# Patient Record
Sex: Female | Born: 1955
Health system: Southern US, Community
[De-identification: ages and names within clinical notes are randomized; demographics above are authoritative.]

## PROBLEM LIST (undated history)

## (undated) DIAGNOSIS — I10 Essential (primary) hypertension: Secondary | ICD-10-CM

## (undated) DIAGNOSIS — K5792 Diverticulitis of intestine, part unspecified, without perforation or abscess without bleeding: Secondary | ICD-10-CM

## (undated) DIAGNOSIS — J449 Chronic obstructive pulmonary disease, unspecified: Secondary | ICD-10-CM

## (undated) DIAGNOSIS — E785 Hyperlipidemia, unspecified: Secondary | ICD-10-CM

## (undated) DIAGNOSIS — E119 Type 2 diabetes mellitus without complications: Secondary | ICD-10-CM

## (undated) DIAGNOSIS — G2581 Restless legs syndrome: Secondary | ICD-10-CM

## (undated) DIAGNOSIS — K219 Gastro-esophageal reflux disease without esophagitis: Secondary | ICD-10-CM

## (undated) DIAGNOSIS — Z720 Tobacco use: Secondary | ICD-10-CM

## (undated) DIAGNOSIS — Z8673 Personal history of transient ischemic attack (TIA), and cerebral infarction without residual deficits: Secondary | ICD-10-CM

## (undated) DIAGNOSIS — F39 Unspecified mood [affective] disorder: Secondary | ICD-10-CM

## (undated) DIAGNOSIS — G8929 Other chronic pain: Secondary | ICD-10-CM

## (undated) HISTORY — DX: Gastro-esophageal reflux disease without esophagitis: K21.9

## (undated) HISTORY — DX: Chronic obstructive pulmonary disease, unspecified: J44.9

## (undated) HISTORY — DX: Diverticulitis of intestine, part unspecified, without perforation or abscess without bleeding: K57.92

## (undated) HISTORY — DX: Type 2 diabetes mellitus without complications: E11.9

## (undated) HISTORY — DX: Unspecified mood (affective) disorder: F39

## (undated) HISTORY — DX: Tobacco use: Z72.0

## (undated) HISTORY — DX: Other chronic pain: G89.29

## (undated) HISTORY — DX: Restless legs syndrome: G25.81

## (undated) HISTORY — DX: Personal history of transient ischemic attack (TIA), and cerebral infarction without residual deficits: Z86.73

## (undated) HISTORY — DX: Hyperlipidemia, unspecified: E78.5

## (undated) HISTORY — DX: Essential (primary) hypertension: I10

---

## 2005-09-06 DIAGNOSIS — B070 Plantar wart: Secondary | ICD-10-CM

## 2005-09-06 HISTORY — DX: Plantar wart: B07.0

## 2018-12-06 DIAGNOSIS — R413 Other amnesia: Secondary | ICD-10-CM

## 2018-12-06 HISTORY — DX: Other amnesia: R41.3

## 2019-01-31 ENCOUNTER — Telehealth: Payer: Self-pay | Admitting: Internal Medicine

## 2019-01-31 NOTE — Telephone Encounter (Signed)
Attempted to call Inetta Fermo at [employer] to get specifics on what is needed in the office notes for the patient visit.  Left message to call back on Tina's voicemail.

## 2019-02-01 NOTE — Telephone Encounter (Signed)
LMTCB x2 for Olivia Bell.  We would need a records release before any documents could be given.

## 2019-02-02 NOTE — Telephone Encounter (Signed)
Called and spoke with [place] with [employer]. Per Inetta Fermo, she is aware that pt has an upcoming appt at our office 6/2 and Inetta Fermo stated she needs to make sure that work status gets addressed at this appt so she will be able to know when pt will be able to return to work and under what restrictions if there are any.  Looked under appts tab and it seems like this consult with MW is a workers com consult so this is why this info is needing to be addressed at the appt.  Routing to MW as an Lorain Childes as pt is having the consult with him 6/2.

## 2019-02-02 NOTE — Telephone Encounter (Signed)
aware

## 2019-02-06 ENCOUNTER — Other Ambulatory Visit: Payer: Self-pay

## 2019-02-06 ENCOUNTER — Ambulatory Visit (INDEPENDENT_AMBULATORY_CARE_PROVIDER_SITE_OTHER): Payer: Worker's Compensation

## 2019-02-06 ENCOUNTER — Encounter: Payer: Self-pay | Admitting: General Surgery

## 2019-02-06 ENCOUNTER — Encounter: Payer: Self-pay | Admitting: Internal Medicine

## 2019-02-06 ENCOUNTER — Ambulatory Visit (INDEPENDENT_AMBULATORY_CARE_PROVIDER_SITE_OTHER): Payer: Worker's Compensation | Admitting: Internal Medicine

## 2019-02-06 VITALS — BP 122/68 | HR 104 | Temp 97.9°F | Ht 66.0 in | Wt 237.6 lb

## 2019-02-06 DIAGNOSIS — I10 Essential (primary) hypertension: Secondary | ICD-10-CM | POA: Insufficient documentation

## 2019-02-06 DIAGNOSIS — R0789 Other chest pain: Secondary | ICD-10-CM | POA: Diagnosis not present

## 2019-02-06 DIAGNOSIS — F17210 Nicotine dependence, cigarettes, uncomplicated: Secondary | ICD-10-CM

## 2019-02-06 DIAGNOSIS — J449 Chronic obstructive pulmonary disease, unspecified: Secondary | ICD-10-CM | POA: Diagnosis not present

## 2019-02-06 IMAGING — DX CHEST - 2 VIEW
2 series · 2 of 2 positions shown · non-contrast
Comparison: None.

CLINICAL DATA: 62-year-old female with chest pain. Smoker.

EXAM:
CHEST - 2 VIEW

[chest pa]
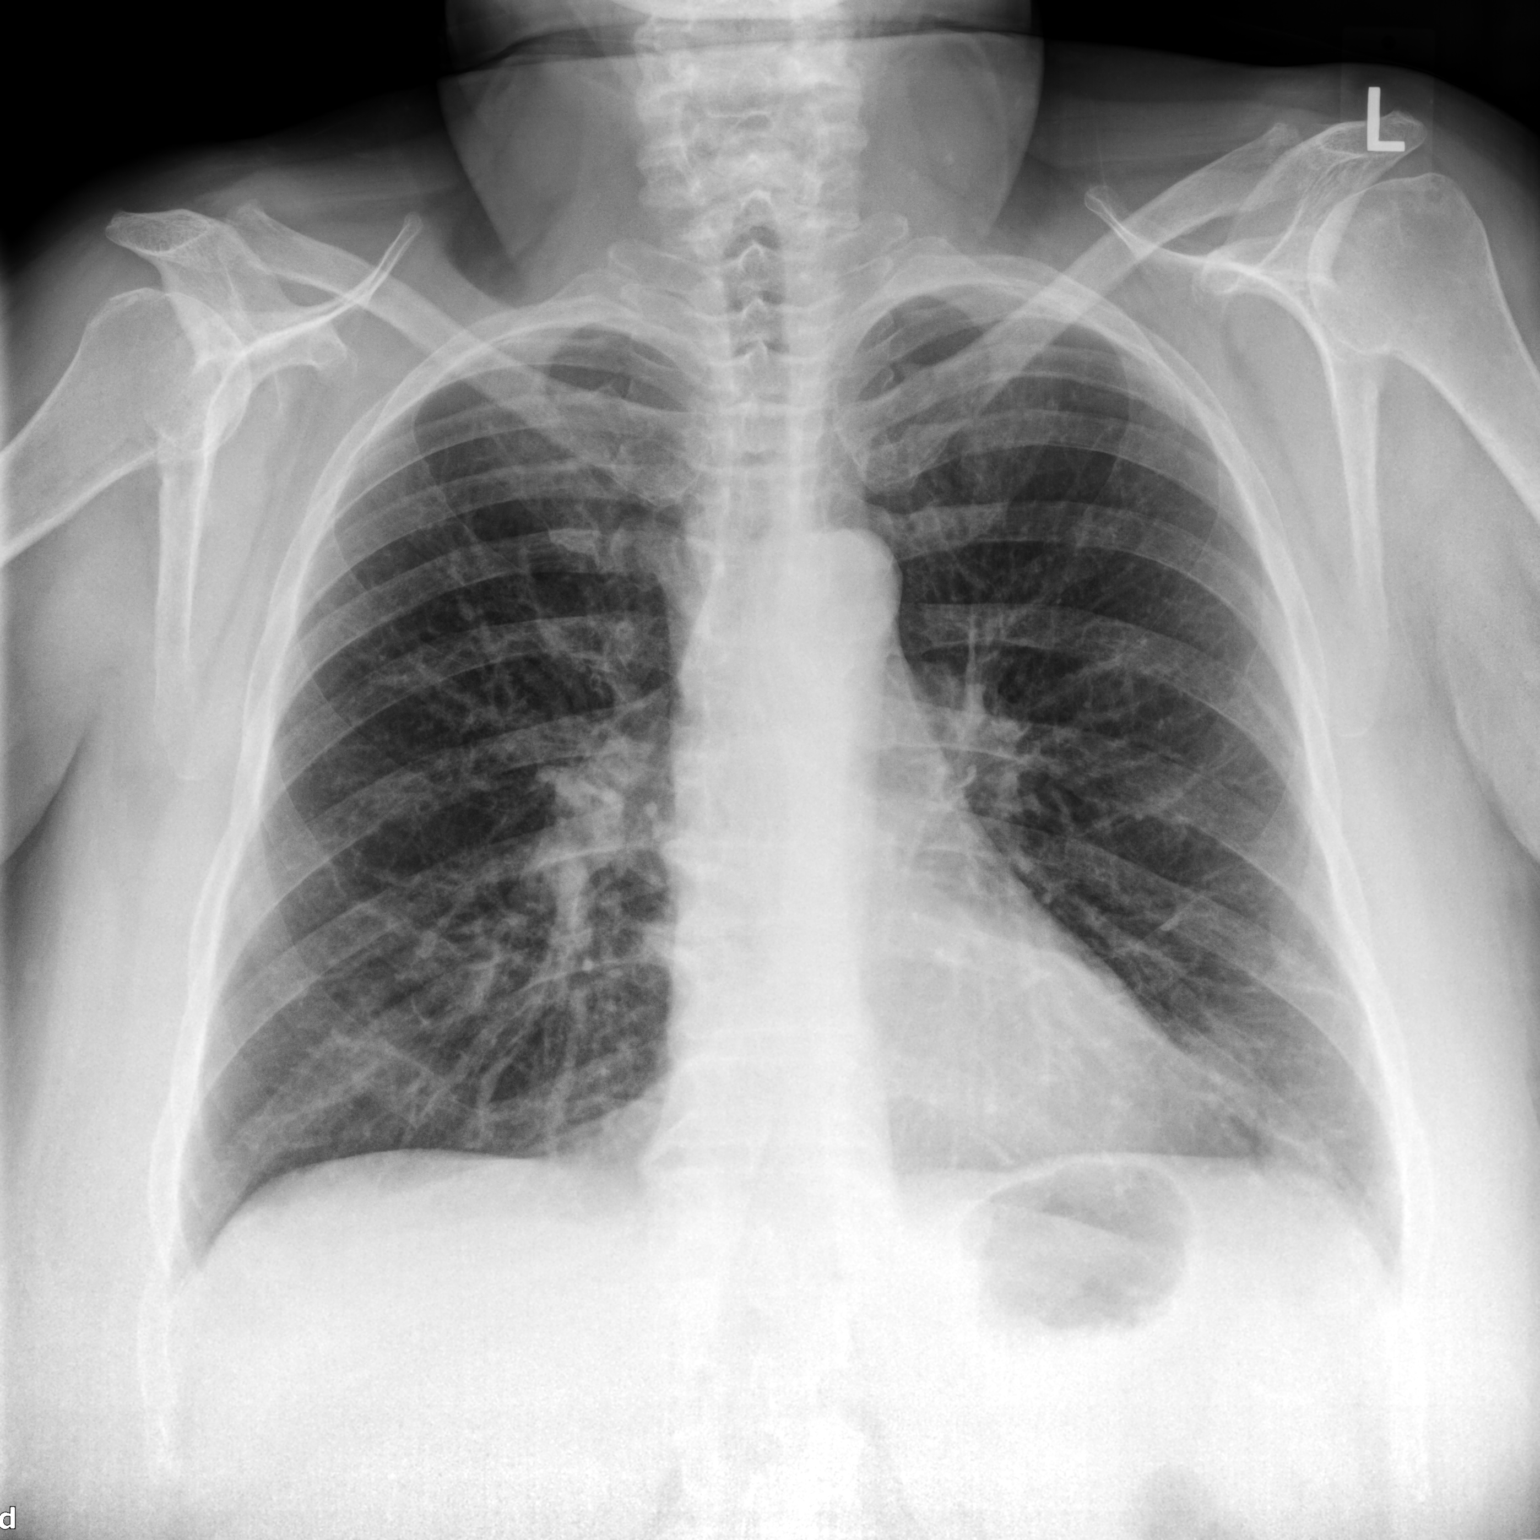

[chest lat]
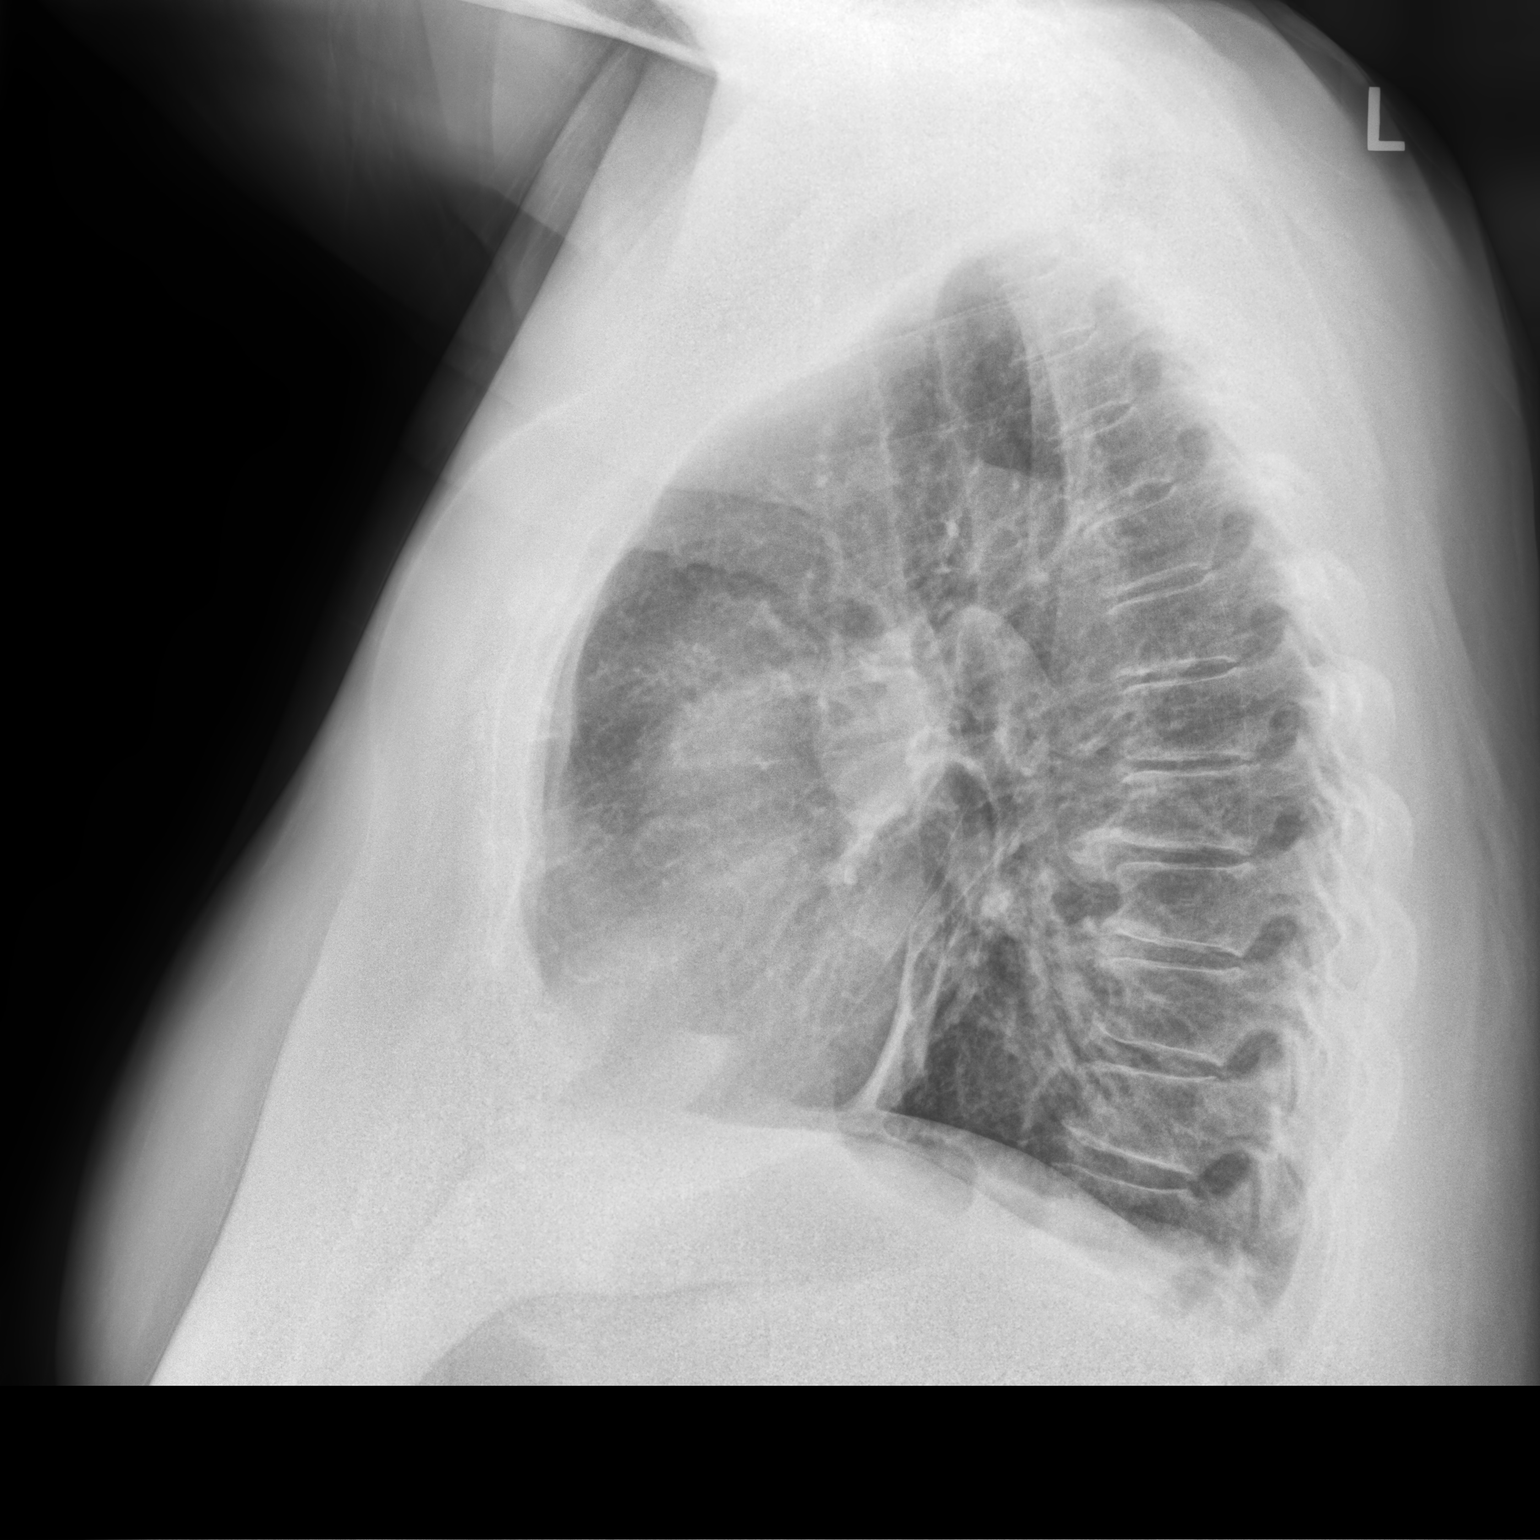

[2 of 2 positions shown; findings below may reference images not displayed]

FINDINGS: Lung volumes are at the upper limits of normal. Mediastinal contours
are within normal limits. Visualized tracheal air column is within
normal limits. No pneumothorax, pulmonary edema, pleural effusion or
confluent pulmonary opacity. Mildly increased interstitial markings,
likely smoking related.

No acute osseous abnormality identified. Negative visible bowel gas
pattern.
IMPRESSION: No acute cardiopulmonary abnormality; increased interstitial
markings are likely smoking related.

## 2019-02-06 MED ORDER — PREDNISONE 10 MG PO TABS: ORAL_TABLET | ORAL | 0 refills | Status: DC

## 2019-02-06 MED ORDER — ACETAMINOPHEN-CODEINE #3 300-30 MG PO TABS: 1.0000 | ORAL_TABLET | ORAL | 0 refills | Status: AC | PRN

## 2019-02-06 MED ORDER — ALBUTEROL SULFATE (2.5 MG/3ML) 0.083% IN NEBU: 2.5000 mg | INHALATION_SOLUTION | RESPIRATORY_TRACT | 12 refills | Status: DC | PRN

## 2019-02-06 MED ORDER — CLONIDINE HCL 0.1 MG PO TABS: 0.1000 mg | ORAL_TABLET | Freq: Two times a day (BID) | ORAL | 11 refills | Status: DC

## 2019-02-06 NOTE — Assessment & Plan Note (Signed)
D/c acei 02/06/2019 due to severe cough   ACE inhibitors are problematic in  pts with airway complaints because  even experienced pulmonologists can't always distinguish ace effects from copd/asthma.  By themselves they don't actually cause a problem, much like oxygen can't by itself start a fire, but they certainly serve as a powerful catalyst or enhancer for any "fire"  or inflammatory process in the upper airway, be it caused by an ET  tube or more commonly reflux (especially in the obese or pts with known GERD or who are on biphoshonates).    In the era of ARB near equivalency until we have a better handle on the reversibility of the airway problem, it just makes sense to avoid ACEI  entirely in the short run use clonidine 0.1 mg bid instead to help with cig w/d/ pain control / anxiety

## 2019-02-06 NOTE — Assessment & Plan Note (Addendum)
Active smoker  - CT Head 12/30/18 neg sinus findings   >>> rec just use neb q4 h prn since won't aggravate the cough which is preventing chest wall healing and of course work on d/c cigs (see separate a/p) plus 6 days prednisone

## 2019-02-06 NOTE — Patient Instructions (Addendum)
Prednisone 10 mg take  4 each am x 2 days,   2 each am x 2 days,  1 each am x 2 days and stop   Stop lisinopril   Clonidine 0.1 twice daily   For cough >  Take delsym two tsp every 12 hours and supplement if needed with  Tylenol #3   up to 1-2 every 4 hours to suppress the urge to cough. Swallowing water and/or using ice chips/non mint and menthol containing candies (such as lifesavers or sugarless jolly ranchers) are also effective.  You should rest your voice and avoid activities that you know make you cough.  Once you have eliminated the cough for 3 straight days try reducing the Tylenol #3 first,  then the delsym as tolerated.    For shortness of breath/ wheezing >  Nebulized albuterol  up to every 4 hours as needed  The key is to stop smoking completely before smoking completely stops you!   Please remember to go to the lab and x-ray department   for your tests - we will call you with the results when they are available.      Please schedule a follow up office visit in 2 weeks, sooner if needed  with all medications /inhalers/ solutions in hand so we can verify exactly what you are taking. This includes all medications from all doctors and over the counters   Late add:  No work until re-evaluate in office in 2 weeks (visit scheduled for 02/21/2019)
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
## 2019-02-06 NOTE — Assessment & Plan Note (Signed)
Counseled re importance of smoking cessation but did not meet time criteria for separate billing    Total time devoted to counseling  > 50 % of initial 60 min office visit:  review case with pt/ discussion of options/alternatives/ personally creating written customized instructions  in presence of pt  then going over those specific  Instructions directly with the pt including how to use all of the meds but in particular covering each new medication in detail and the difference between the maintenance= "automatic" meds and the prns using an action plan format for the latter (If this problem/symptom => do that organization reading Left to right).  Please see AVS from this visit for a full list of these instructions which I personally wrote for this pt and  are unique to this visit.  

## 2019-02-07 LAB — CBC WITH DIFFERENTIAL/PLATELET
Basophils Absolute: 0.1 10*3/uL (ref 0.0–0.1)
Basophils Relative: 1 % (ref 0.0–3.0)
Eosinophils Absolute: 0.5 10*3/uL (ref 0.0–0.7)
Eosinophils Relative: 4 % (ref 0.0–5.0)
HCT: 45.6 % (ref 36.0–46.0)
Hemoglobin: 15.8 g/dL — ABNORMAL HIGH (ref 12.0–15.0)
Lymphocytes Relative: 35 % (ref 12.0–46.0)
Lymphs Abs: 4.6 10*3/uL — ABNORMAL HIGH (ref 0.7–4.0)
MCHC: 34.5 g/dL (ref 30.0–36.0)
MCV: 92.5 fl (ref 78.0–100.0)
Monocytes Absolute: 0.7 10*3/uL (ref 0.1–1.0)
Monocytes Relative: 5 % (ref 3.0–12.0)
Neutro Abs: 7.2 10*3/uL (ref 1.4–7.7)
Neutrophils Relative %: 55 % (ref 43.0–77.0)
Platelets: 293 10*3/uL (ref 150.0–400.0)
RBC: 4.93 Mil/uL (ref 3.87–5.11)
RDW: 13.1 % (ref 11.5–15.5)
WBC: 13.2 10*3/uL — ABNORMAL HIGH (ref 4.0–10.5)

## 2019-02-07 LAB — BASIC METABOLIC PANEL
BUN: 10 mg/dL (ref 6–23)
CO2: 26 mEq/L (ref 19–32)
Calcium: 9.5 mg/dL (ref 8.4–10.5)
Chloride: 97 mEq/L (ref 96–112)
Creatinine, Ser: 0.67 mg/dL (ref 0.40–1.20)
GFR: 89.52 mL/min (ref >60.00)
Glucose, Bld: 286 mg/dL — ABNORMAL HIGH (ref 70–99)
Potassium: 4.4 mEq/L (ref 3.5–5.1)
Sodium: 132 mEq/L — ABNORMAL LOW (ref 135–145)

## 2019-02-07 LAB — SEDIMENTATION RATE: Sed Rate: 41 mm/hr — ABNORMAL HIGH (ref 0–30)

## 2019-02-07 NOTE — Progress Notes (Signed)
lmtcb

## 2019-02-07 NOTE — Progress Notes (Signed)
LMTCB

## 2019-02-08 ENCOUNTER — Encounter: Payer: Self-pay | Admitting: Internal Medicine

## 2019-02-08 ENCOUNTER — Telehealth: Payer: Self-pay | Admitting: Internal Medicine

## 2019-02-08 MED ORDER — BENZONATATE 200 MG PO CAPS: 200.0000 mg | ORAL_CAPSULE | Freq: Three times a day (TID) | ORAL | 0 refills | Status: DC | PRN

## 2019-02-08 NOTE — Telephone Encounter (Signed)
The codeine is in Tylenol #3 and should do if takes 2 q 4h but ok to supplement with tessalon 200 # 45  One q 8 h prn

## 2019-02-08 NOTE — Telephone Encounter (Signed)
Pt made aware she want tessalon sent into [facility] Nothing further needed.
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
## 2019-02-08 NOTE — Telephone Encounter (Signed)
Spoke with pt, her cough has gotten worse since being placed on the medications and her side is sore. She has done everything as MW recommended. She feels like she is coughing more with mucus/beige-cream color. She is requesting tessalon pearles because her daughter told her that they helped her with her cough. MW please advise.   Patient Instructions by Nyoka Cowden, MD at 02/06/2019 4:15 PM  Author: Nyoka Cowden, MD Author Type: Physician Filed: 02/06/2019 4:56 PM  Note Status: Addendum Sebastian Ache: Cosign Not Required Encounter Date: 02/06/2019  Editor: Nyoka Cowden, MD (Physician)  Prior Versions: 1. Nyoka Cowden, MD (Physician) at 02/06/2019 4:49 PM - Signed    Prednisone 10 mg take  4 each am x 2 days,   2 each am x 2 days,  1 each am x 2 days and stop   Stop lisinopril   Clonidine 0.1 twice daily   For cough >  Take delsym two tsp every 12 hours and supplement if needed with  Tylenol #3   up to 1-2 every 4 hours to suppress the urge to cough. Swallowing water and/or using ice chips/non mint and menthol containing candies (such as lifesavers or sugarless jolly ranchers) are also effective.  You should rest your voice and avoid activities that you know make you cough.  Once you have eliminated the cough for 3 straight days try reducing the Tylenol #3 first,  then the delsym as tolerated.    For shortness of breath/ wheezing >  Nebulized albuterol  up to every 4 hours as needed  The key is to stop smoking completely before smoking completely stops you!   Please remember to go to the lab and x-ray department   for your tests - we will call you with the results when they are available.      Please schedule a follow up office visit in 2 weeks, sooner if needed  with all medications /inhalers/ solutions in hand so we can verify exactly what you are taking. This includes all medications from all doctors and over the counters    Instructions   Prednisone 10 mg take  4 each am  x 2 days,   2 each am x 2 days,  1 each am x 2 days and stop   Stop lisinopril   Clonidine 0.1 twice daily

## 2019-02-21 ENCOUNTER — Ambulatory Visit (INDEPENDENT_AMBULATORY_CARE_PROVIDER_SITE_OTHER): Payer: Worker's Compensation | Admitting: Internal Medicine

## 2019-02-21 ENCOUNTER — Telehealth: Payer: Self-pay | Admitting: Internal Medicine

## 2019-02-21 ENCOUNTER — Encounter: Payer: Self-pay | Admitting: *Deleted

## 2019-02-21 ENCOUNTER — Other Ambulatory Visit: Payer: Self-pay

## 2019-02-21 ENCOUNTER — Encounter: Payer: Self-pay | Admitting: Internal Medicine

## 2019-02-21 DIAGNOSIS — R0789 Other chest pain: Secondary | ICD-10-CM | POA: Diagnosis not present

## 2019-02-21 DIAGNOSIS — F17210 Nicotine dependence, cigarettes, uncomplicated: Secondary | ICD-10-CM | POA: Diagnosis not present

## 2019-02-21 DIAGNOSIS — I10 Essential (primary) hypertension: Secondary | ICD-10-CM | POA: Diagnosis not present

## 2019-02-21 DIAGNOSIS — R058 Other specified cough: Secondary | ICD-10-CM | POA: Insufficient documentation

## 2019-02-21 DIAGNOSIS — J449 Chronic obstructive pulmonary disease, unspecified: Secondary | ICD-10-CM | POA: Diagnosis not present

## 2019-02-21 DIAGNOSIS — R05 Cough: Secondary | ICD-10-CM

## 2019-02-21 MED ORDER — AZITHROMYCIN 250 MG PO TABS: ORAL_TABLET | ORAL | 0 refills | Status: DC

## 2019-02-21 MED ORDER — DM-GUAIFENESIN ER 30-600 MG PO TB12: ORAL_TABLET | ORAL | 11 refills | Status: DC

## 2019-02-21 MED ORDER — FLUTTER DEVI: 0 refills | Status: AC

## 2019-02-21 MED ORDER — PREDNISONE 10 MG PO TABS: ORAL_TABLET | ORAL | 0 refills | Status: DC

## 2019-02-21 MED ORDER — PANTOPRAZOLE SODIUM 40 MG PO TBEC: DELAYED_RELEASE_TABLET | ORAL | 2 refills | Status: DC

## 2019-02-21 NOTE — Telephone Encounter (Signed)
Called patient she states pharmacy did not have rx for Mucinex. Made aware it was sent. Called pharmacy and verified rx received. Per pharmacist pt ins. May not cover rx. 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 Nothing further needed.

## 2019-02-21 NOTE — Assessment & Plan Note (Signed)
Off acei as of 02/06/19  - added flutter/ tyl #3 / gerd rx 02/21/2019   Upper airway cough syndrome (previously labeled PNDS),  is so named because it's frequently impossible to sort out how much is  CR/sinusitis with freq throat clearing (which can be related to primary GERD)   vs  causing  secondary (" extra esophageal")  GERD from wide swings in gastric pressure that occur with throat clearing, often  promoting self use of mint and menthol lozenges that reduce the lower esophageal sphincter tone and exacerbate the problem further in a cyclical fashion.   These are the same pts (now being labeled as having "irritable larynx syndrome" by some cough centers) who not infrequently have a history of having failed to tolerate ace inhibitors,  dry powder inhalers or biphosphonates or report having atypical/extraesophageal reflux symptoms that don't respond to standard doses of PPI  and are easily confused as having aecopd or asthma flares by even experienced allergists/ pulmonologists (myself included).  Of the three most common causes of  Sub-acute / recurrent or chronic cough, only one (GERD)  can actually contribute to/ trigger  the other two (asthma and post nasal drip syndrome)  and perpetuate the cylce of cough.  While not intuitively obvious, many patients with chronic low grade reflux do not cough until there is a primary insult that disturbs the protective epithelial barrier and exposes sensitive nerve endings.   This is typically viral but can due to PNDS and  either may apply here.     >>> The point is that once this occurs, it is difficult to eliminate the cycle  using anything but a maximally effective acid suppression regimen at least in the short run, accompanied by an appropriate diet to address non acid GERD and control / eliminate the cough itself for at least 3 days with tyl #3     I had an extended discussion with the patient reviewing all relevant studies completed to date and  lasting 25  minutes of a 40  minute office  visit addressing  severe non-specific but potentially very serious refractory respiratory symptoms of uncertain and potentially multiple  etiologies.   Each maintenance medication was reviewed in detail including most importantly the difference between maintenance and prns and under what circumstances the prns are to be triggered using an action plan format that is not reflected in the computer generated alphabetically organized AVS.    Please see AVS for specific instructions unique to this office visit that I personally wrote and verbalized to the the pt in detail and then reviewed with pt  by my nurse highlighting any changes in therapy/plan of care  recommended at today's visit.
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
## 2019-02-21 NOTE — Assessment & Plan Note (Signed)
D/c acei 02/06/2019 due to severe cough   Adequate control on present rx, reviewed in detail with pt > no change in rx needed    Although even in retrospect it may not be clear the ACEi contributed to the pt's symptoms,    adding them back at this point or in the future would risk confusion in interpretation of non-specific respiratory symptoms to which this patient is prone  ie  Better not to muddy the waters here.   >>>> Continue clonidine 0.1 mg bid

## 2019-02-21 NOTE — Progress Notes (Signed)
Patient seen in the office today and instructed on use of flutter valve.  Patient expressed understanding.
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
## 2019-02-21 NOTE — Telephone Encounter (Signed)
Called patient she states pharmacy did not have rx for Mucinex. Made aware it was sent. Called pharmacy and verified rx received. Per pharmacist pt ins. May not cover rx. 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 ATC/left detailed message that pharm does have rx. Nothing further needed.

## 2019-02-21 NOTE — Assessment & Plan Note (Signed)
Counseled re importance of smoking cessation but did not meet time criteria for separate billing   °

## 2019-02-21 NOTE — Assessment & Plan Note (Addendum)
Active smoker  - CT Head 12/30/18 neg sinus findings    rec zpak/ pred x 6 days, continue neb up to q 4 h prn/ work on d/c cigs (see separate a/p)

## 2019-02-21 NOTE — Patient Instructions (Addendum)
Take mucinex dm 600 take x 2  every 12 hours and supplement if needed with  Tylenol #3   up to 1-2 every 4 hours to suppress the urge to cough. Swallowing water and/or using ice chips/non mint and menthol containing candies (such as lifesavers or sugarless jolly ranchers) are also effective.  You should rest your voice and avoid activities that you know make you cough.  Once you have eliminated the cough for 3 straight days try reducing the Tylenol #3 first,  then the mucinex dm  as tolerated.   Any time you start coughing use the flutter valve   zpak   Prednisone 10 mg take  4 each am x 2 days,   2 each am x 2 days,  1 each am x 2 days and stop   Protonix 40 mg Take 30- 60 min before your first and last meals of the day   GERD (REFLUX)  is an extremely common cause of respiratory symptoms just like yours , many times with no obvious heartburn at all.    It can be treated with medication, but also with lifestyle changes including elevation of the head of your bed (ideally with 6 -8inch blocks under the headboard of your bed),  Smoking cessation, avoidance of late meals, excessive alcohol, and avoid fatty foods, chocolate, peppermint, colas, red wine, and acidic juices such as orange juice.  NO MINT OR MENTHOL PRODUCTS SO NO COUGH DROPS  USE SUGARLESS CANDY INSTEAD (Jolley ranchers or Stover's or Life Savers) or even ice chips will also do - the key is to swallow to prevent all throat clearing. NO OIL BASED VITAMINS - use powdered substitutes.  Avoid fish oil when coughing.  Please schedule a follow up office visit in 2 weeks, sooner if needed - no work in meantime

## 2019-02-26 ENCOUNTER — Telehealth: Payer: Self-pay | Admitting: Internal Medicine

## 2019-02-26 NOTE — Telephone Encounter (Signed)
LMTCB for Tina 

## 2019-02-27 MED ORDER — DM-GUAIFENESIN ER 30-600 MG PO TB12: ORAL_TABLET | ORAL | 11 refills | Status: DC

## 2019-02-27 NOTE — Telephone Encounter (Signed)
[employer] returning call and can be reached @ [phone] [facility].Hillery Hunter

## 2019-02-27 NOTE — Telephone Encounter (Signed)
Spoke with Olivia Bell. She stated that [facility] does not carry the generic for Mucinex but does carry the brand name. They will be able to fill the brand name once they have a RX that states that brand can be dispensed. Advised her that I would go ahead and change the RX and resend it. She verbalized understanding.   Nothing further needed at time of call.

## 2019-03-07 ENCOUNTER — Encounter: Payer: Self-pay | Admitting: Internal Medicine

## 2019-03-07 ENCOUNTER — Other Ambulatory Visit: Payer: Self-pay

## 2019-03-07 ENCOUNTER — Ambulatory Visit (INDEPENDENT_AMBULATORY_CARE_PROVIDER_SITE_OTHER): Payer: Worker's Compensation | Admitting: Internal Medicine

## 2019-03-07 DIAGNOSIS — R05 Cough: Secondary | ICD-10-CM | POA: Diagnosis not present

## 2019-03-07 DIAGNOSIS — R0789 Other chest pain: Secondary | ICD-10-CM | POA: Diagnosis not present

## 2019-03-07 DIAGNOSIS — R058 Other specified cough: Secondary | ICD-10-CM

## 2019-03-07 DIAGNOSIS — J449 Chronic obstructive pulmonary disease, unspecified: Secondary | ICD-10-CM | POA: Diagnosis not present

## 2019-03-07 DIAGNOSIS — F17210 Nicotine dependence, cigarettes, uncomplicated: Secondary | ICD-10-CM

## 2019-03-07 DIAGNOSIS — I10 Essential (primary) hypertension: Secondary | ICD-10-CM

## 2019-03-07 NOTE — Patient Instructions (Addendum)
Cough > mucinex dm 1200 mg every 12 hours and flutter valve as much as you can    Protonix 40 mg Take 30- 60 min before your first and last meals of the day   Only use your albuterol as a rescue medication to be used if you can't catch your breath by resting or doing a relaxed purse lip breathing pattern.  - The less you use it, the better it will work when you need it. - Ok to use up to  every 4 hours if you must but call for immediate appointment if use goes up over your usual need - Don't leave home without it !!  (think of it like the spare tire for your car)   GERD (REFLUX)  is an extremely common cause of respiratory symptoms just like yours , many times with no obvious heartburn at all.    It can be treated with medication, but also with lifestyle changes including elevation of the head of your bed (ideally with 6 -8inch blocks under the headboard of your bed),  Smoking cessation, avoidance of late meals, excessive alcohol, and avoid fatty foods, chocolate, peppermint, colas, red wine, and acidic juices such as orange juice.  NO MINT OR MENTHOL PRODUCTS SO NO COUGH DROPS  USE SUGARLESS CANDY INSTEAD (Jolley ranchers or Stover's or Life Savers) or even ice chips will also do - the key is to swallow to prevent all throat clearing. NO OIL BASED VITAMINS - use powdered substitutes.  Avoid fish oil when coughing.    Follow up can be here if approved by workers comp but you must bring all active medications/ devices.
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
## 2019-03-11 ENCOUNTER — Encounter: Payer: Self-pay | Admitting: Internal Medicine

## 2019-03-11 NOTE — Assessment & Plan Note (Signed)
D/c acei 02/06/2019 due to severe cough > improved 03/07/2019 but not resolved  Adequate control on present rx, reviewed in detail with pt > no change in rx needed    NB : Although even in retrospect it may not be clear the ACEi contributed to the pt's symptoms,  Pt improved off them and adding them back at this point or in the future would risk confusion in interpretation of non-specific respiratory symptoms to which this patient is prone  ie  Better not to muddy the waters here.
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
## 2019-03-11 NOTE — Assessment & Plan Note (Signed)
Active smoker  - CT Head 12/30/18 neg sinus findings   Considering active smoking this problem appears improved on duoneb qid, no changes needed

## 2019-03-11 NOTE — Assessment & Plan Note (Addendum)
Counseled re importance of smoking cessation but did not meet time criteria for separate billing    I had an extended discussion with the patient reviewing all relevant studies completed to date and  lasting 25 minutes of a 40  minute extended office  visit  addressing severe non-specific but potentially very serious refractory respiratory symptoms of uncertain and potentially multiple  Etiologies.  See device teaching which extended face to face time for this visit    Each maintenance medication was reviewed in detail including most importantly the difference between maintenance and prns and under what circumstances the prns are to be triggered using an action plan format that is not reflected in the computer generated alphabetically organized AVS.    Please see AVS for specific instructions unique to this office visit that I personally wrote and verbalized to the the pt in detail and then reviewed with pt  by my nurse highlighting any changes in therapy/plan of care  recommended at today's visit.       Ok to f/u prn but must return with all meds in hand using a trust but verify approach to confirm accurate Medication  Reconciliation The principal here is that until we are certain that the  patients are doing what we've asked, it makes no sense to ask them to do more.

## 2019-03-11 NOTE — Assessment & Plan Note (Addendum)
Off acei as of 02/06/19  - added flutter/ tyl #3 / gerd rx 02/21/2019 > did not follow instructions as of 03/07/2019 > repeat same rx   Of the three most common causes of  Sub-acute / recurrent or chronic cough, only one (GERD)  can actually contribute to/ trigger  the other two (asthma and post nasal drip syndrome)  and perpetuate the cylce of cough.  While not intuitively obvious, many patients with chronic low grade reflux do not cough until there is a primary insult that disturbs the protective epithelial barrier and exposes sensitive nerve endings.   This is typically viral but can due to PNDS and  either may apply here.    >>>  The point is that once this occurs, it is difficult to eliminate the cycle  using anything but a maximally effective acid suppression regimen at least in the short run, accompanied by an appropriate diet to address non acid GERD and control cough trauma to airways by using flutter valve and eliminate the cough itself for at least 3 days with tylenol # 3 then return here for f/u if approved by worker's comp  - The proper method of use, as well as anticipated side effects, of a flutter valve  were   discussed and demonstrated to the patient.

## 2019-03-28 ENCOUNTER — Other Ambulatory Visit: Payer: Self-pay | Admitting: Internal Medicine

## 2019-03-28 NOTE — Telephone Encounter (Signed)
Is this appropriate for refill ? 

## 2019-05-16 ENCOUNTER — Telehealth: Payer: Self-pay | Admitting: Internal Medicine

## 2019-05-16 NOTE — Telephone Encounter (Signed)
Left message for Olivia Bell to call back. Patient was last seen in July 2020 and was advised to follow as needed as  Workers comp would allow.

## 2019-05-18 NOTE — Telephone Encounter (Signed)
LMTCB x2 for Olivia Bell.

## 2019-05-18 NOTE — Telephone Encounter (Signed)
Juleen China called back regarding peer to peer and trying to find out if there is a charge for this visit.  [phone].  8-4:30 EST M-F.   See prior notes.

## 2019-05-18 NOTE — Telephone Encounter (Signed)
Spoke with Olivia Bell. Advised him that to my knowledge we do not charge for Peer to Peer. Olivia Bell states that this Peer to Peer would involve Dr. Melvyn Novas speaking with a pharmacist about the medications that he prescribed for the pt.  MW - please advise if you would be willing to do this. Thanks.

## 2019-05-21 NOTE — Telephone Encounter (Signed)
Called spoke with Juleen China, he states he wants a peer to peer with Dr. Melvyn Novas going over medications Dr. Melvyn Novas has RXd patient. There was a document mailed to Dr. Melvyn Novas with all these medications on it. I have asked Juleen China to refax the document with the medications on it so Dr. Melvyn Novas can look and then advise if he is able to do a 10-15 minute peer to peer on the medications RXd by Dr. Melvyn Novas.   Once I receive the fax I will route to Dr. Melvyn Novas and put document on his desk.

## 2019-05-21 NOTE — Telephone Encounter (Signed)
Received fax. Placed on Dr. Gustavus Bryant desk. Will route to Dr. Melvyn Novas to please advise previous note

## 2019-05-21 NOTE — Telephone Encounter (Signed)
If this is related on going meds I prescribed then yes I can comment but the plan was for her to see PCP for f/u longterm (beyond 3 months which is basically now)  since some of the meds (eg clonidine)  were not directly related to her woker's comp

## 2019-05-22 NOTE — Telephone Encounter (Signed)
Called and spoke with Juleen China, let him know Dr. Gustavus Bryant recs. Nothing further needed at this time.

## 2019-05-22 NOTE — Telephone Encounter (Signed)
I reviewed the paperwork from pharmacy and there is nothing I have to add to the notes I have written which justify all the medications I prescribed - so no need for peer to peer with a pharmacist.  I am  happy to see her back at any point re-imbursed by workers comp or her own insurance and we can go over the same info again.

## 2019-05-31 ENCOUNTER — Ambulatory Visit: Payer: Self-pay | Admitting: Internal Medicine

## 2020-11-16 ENCOUNTER — Inpatient Hospital Stay (HOSPITAL_COMMUNITY)
Admission: EM | Admit: 2020-11-16 | Discharge: 2020-11-19 | DRG: 066 | Disposition: A | Discharge status: Home Health | Payer: Medicaid Other | Source: Other Acute Inpatient Hospital | Attending: Internal Medicine | Admitting: Internal Medicine

## 2020-11-16 ENCOUNTER — Encounter (HOSPITAL_COMMUNITY): Payer: Self-pay | Admitting: Internal Medicine

## 2020-11-16 DIAGNOSIS — I6330 Cerebral infarction due to thrombosis of unspecified cerebral artery: Secondary | ICD-10-CM | POA: Insufficient documentation

## 2020-11-16 DIAGNOSIS — IMO0002 Reserved for concepts with insufficient information to code with codable children: Secondary | ICD-10-CM

## 2020-11-16 DIAGNOSIS — I639 Cerebral infarction, unspecified: Principal | ICD-10-CM | POA: Diagnosis present

## 2020-11-16 DIAGNOSIS — I10 Essential (primary) hypertension: Secondary | ICD-10-CM | POA: Diagnosis present

## 2020-11-16 DIAGNOSIS — F17210 Nicotine dependence, cigarettes, uncomplicated: Secondary | ICD-10-CM | POA: Diagnosis present

## 2020-11-16 DIAGNOSIS — Z79899 Other long term (current) drug therapy: Secondary | ICD-10-CM

## 2020-11-16 DIAGNOSIS — R4701 Aphasia: Secondary | ICD-10-CM | POA: Diagnosis present

## 2020-11-16 DIAGNOSIS — Z8673 Personal history of transient ischemic attack (TIA), and cerebral infarction without residual deficits: Secondary | ICD-10-CM | POA: Diagnosis not present

## 2020-11-16 DIAGNOSIS — F40240 Claustrophobia: Secondary | ICD-10-CM | POA: Diagnosis present

## 2020-11-16 DIAGNOSIS — Z23 Encounter for immunization: Secondary | ICD-10-CM

## 2020-11-16 DIAGNOSIS — R262 Difficulty in walking, not elsewhere classified: Secondary | ICD-10-CM | POA: Diagnosis present

## 2020-11-16 DIAGNOSIS — Z880 Allergy status to penicillin: Secondary | ICD-10-CM

## 2020-11-16 DIAGNOSIS — I6340 Cerebral infarction due to embolism of unspecified cerebral artery: Secondary | ICD-10-CM | POA: Insufficient documentation

## 2020-11-16 DIAGNOSIS — R42 Dizziness and giddiness: Secondary | ICD-10-CM | POA: Diagnosis present

## 2020-11-16 DIAGNOSIS — E1165 Type 2 diabetes mellitus with hyperglycemia: Secondary | ICD-10-CM | POA: Diagnosis present

## 2020-11-16 DIAGNOSIS — Z7982 Long term (current) use of aspirin: Secondary | ICD-10-CM | POA: Diagnosis not present

## 2020-11-16 DIAGNOSIS — Z87820 Personal history of traumatic brain injury: Secondary | ICD-10-CM | POA: Diagnosis not present

## 2020-11-16 DIAGNOSIS — N39490 Overflow incontinence: Secondary | ICD-10-CM | POA: Diagnosis present

## 2020-11-16 DIAGNOSIS — Z7984 Long term (current) use of oral hypoglycemic drugs: Secondary | ICD-10-CM | POA: Diagnosis not present

## 2020-11-16 DIAGNOSIS — J449 Chronic obstructive pulmonary disease, unspecified: Secondary | ICD-10-CM | POA: Diagnosis present

## 2020-11-16 DIAGNOSIS — J4489 Other specified chronic obstructive pulmonary disease: Secondary | ICD-10-CM

## 2020-11-16 DIAGNOSIS — Z9119 Patient's noncompliance with other medical treatment and regimen: Secondary | ICD-10-CM | POA: Diagnosis not present

## 2020-11-16 DIAGNOSIS — Z91030 Bee allergy status: Secondary | ICD-10-CM

## 2020-11-16 LAB — COMPREHENSIVE METABOLIC PANEL
ALT: 14 U/L (ref 0–44)
AST: 17 U/L (ref 15–41)
Albumin: 2.6 g/dL — ABNORMAL LOW (ref 3.5–5.0)
Alkaline Phosphatase: 81 U/L (ref 38–126)
Anion gap: 5 (ref 5–15)
BUN: 15 mg/dL (ref 8–23)
CO2: 29 mmol/L (ref 22–32)
Calcium: 8.5 mg/dL — ABNORMAL LOW (ref 8.9–10.3)
Chloride: 97 mmol/L — ABNORMAL LOW (ref 98–111)
Creatinine, Ser: 0.76 mg/dL (ref 0.44–1.00)
GFR, Estimated: >60 mL/min (ref >60)
Glucose, Bld: 234 mg/dL — ABNORMAL HIGH (ref 70–99)
Potassium: 4 mmol/L (ref 3.5–5.1)
Sodium: 131 mmol/L — ABNORMAL LOW (ref 135–145)
Total Bilirubin: 0.3 mg/dL (ref 0.3–1.2)
Total Protein: 5.9 g/dL — ABNORMAL LOW (ref 6.5–8.1)

## 2020-11-16 LAB — CBC
HCT: 41.8 % (ref 36.0–46.0)
Hemoglobin: 14.3 g/dL (ref 12.0–15.0)
MCH: 31.3 pg (ref 26.0–34.0)
MCHC: 34.2 g/dL (ref 30.0–36.0)
MCV: 91.5 fL (ref 80.0–100.0)
Platelets: 289 10*3/uL (ref 150–400)
RBC: 4.57 MIL/uL (ref 3.87–5.11)
RDW: 12.5 % (ref 11.5–15.5)
WBC: 14 10*3/uL — ABNORMAL HIGH (ref 4.0–10.5)
nRBC: 0 % (ref 0.0–0.2)

## 2020-11-16 LAB — GLUCOSE, CAPILLARY: Glucose-Capillary: 257 mg/dL — ABNORMAL HIGH (ref 70–99)

## 2020-11-16 LAB — MAGNESIUM: Magnesium: 1.9 mg/dL (ref 1.7–2.4)

## 2020-11-16 LAB — HEMOGLOBIN A1C
Hgb A1c MFr Bld: 12 % — ABNORMAL HIGH (ref 4.8–5.6)
Mean Plasma Glucose: 297.7 mg/dL

## 2020-11-16 LAB — HIV ANTIBODY (ROUTINE TESTING W REFLEX): HIV Screen 4th Generation wRfx: NONREACTIVE

## 2020-11-16 MED ORDER — ACETAMINOPHEN 160 MG/5ML PO SOLN: 650.0000 mg | ORAL | Status: DC | PRN

## 2020-11-16 MED ORDER — ASPIRIN EC 81 MG PO TBEC
81.0000 mg | DELAYED_RELEASE_TABLET | Freq: Every day | ORAL | Status: DC
  Administered 2020-11-16 – 2020-11-19 (×4): 81 mg via ORAL
  Filled 2020-11-16 (×4): qty 1

## 2020-11-16 MED ORDER — ACETAMINOPHEN 325 MG PO TABS
650.0000 mg | ORAL_TABLET | ORAL | Status: DC | PRN
  Administered 2020-11-16 – 2020-11-17 (×2): 650 mg via ORAL
  Filled 2020-11-16 (×2): qty 2

## 2020-11-16 MED ORDER — SODIUM CHLORIDE 0.9 % IV SOLN: INTRAVENOUS | Status: DC

## 2020-11-16 MED ORDER — ACETAMINOPHEN 650 MG RE SUPP: 650.0000 mg | RECTAL | Status: DC | PRN

## 2020-11-16 MED ORDER — INSULIN ASPART 100 UNIT/ML ~~LOC~~ SOLN
0.0000 [IU] | Freq: Three times a day (TID) | SUBCUTANEOUS | Status: DC
  Administered 2020-11-17 (×2): 5 [IU] via SUBCUTANEOUS
  Administered 2020-11-17: 15 [IU] via SUBCUTANEOUS
  Administered 2020-11-18: 8 [IU] via SUBCUTANEOUS
  Administered 2020-11-18: 2 [IU] via SUBCUTANEOUS
  Administered 2020-11-18: 5 [IU] via SUBCUTANEOUS
  Administered 2020-11-19: 3 [IU] via SUBCUTANEOUS
  Administered 2020-11-19: 5 [IU] via SUBCUTANEOUS

## 2020-11-16 MED ORDER — LISINOPRIL 10 MG PO TABS
10.0000 mg | ORAL_TABLET | Freq: Every day | ORAL | Status: DC
  Filled 2020-11-16: qty 1

## 2020-11-16 MED ORDER — STROKE: EARLY STAGES OF RECOVERY BOOK
Freq: Once | Status: AC
  Filled 2020-11-16: qty 1

## 2020-11-16 MED ORDER — SENNOSIDES-DOCUSATE SODIUM 8.6-50 MG PO TABS: 1.0000 | ORAL_TABLET | Freq: Every evening | ORAL | Status: DC | PRN

## 2020-11-16 MED ORDER — NICOTINE 14 MG/24HR TD PT24
14.0000 mg | MEDICATED_PATCH | Freq: Every day | TRANSDERMAL | Status: DC
  Administered 2020-11-16 – 2020-11-19 (×4): 14 mg via TRANSDERMAL
  Filled 2020-11-16 (×5): qty 1

## 2020-11-16 MED ORDER — INSULIN ASPART 100 UNIT/ML ~~LOC~~ SOLN
0.0000 [IU] | Freq: Every day | SUBCUTANEOUS | Status: DC
  Administered 2020-11-16: 3 [IU] via SUBCUTANEOUS
  Administered 2020-11-18: 2 [IU] via SUBCUTANEOUS

## 2020-11-16 MED ORDER — HYDROCHLOROTHIAZIDE 12.5 MG PO CAPS
12.5000 mg | ORAL_CAPSULE | Freq: Every day | ORAL | Status: DC
  Administered 2020-11-16: 12.5 mg via ORAL
  Filled 2020-11-16 (×2): qty 1

## 2020-11-16 MED ORDER — LISINOPRIL 5 MG PO TABS
5.0000 mg | ORAL_TABLET | Freq: Every day | ORAL | Status: DC
  Filled 2020-11-16: qty 1

## 2020-11-16 MED ORDER — IPRATROPIUM-ALBUTEROL 0.5-2.5 (3) MG/3ML IN SOLN: 3.0000 mL | Freq: Four times a day (QID) | RESPIRATORY_TRACT | Status: DC | PRN

## 2020-11-16 NOTE — H&P (Signed)
History and Physical    [name] UKG:[id number] DOB: March 18, 1956 DOA: 11/16/2020  PCP: Patient, No Pcp Per   Patient coming from: Home via [facility] ER  Chief Complaint: Difficulty speaking, "Don't feel right"  HPI: Olivia Bell is a 65 y.o. female with medical history significant for diabetes mellitus type II, hypertension, tobacco abuse who presents from [facility] ER in transfer for MRI of the brain.  Patient reports that she has not been feeling "right" the last 2 to 3 days.  She states this started as feeling like she was off balance and generalized weakness.  On Saturday, November 15, 2020 she laid down to take a nap in the afternoon and felt normal but when she woke up a few hours later she was not able to speak.  She states she had difficulty finding and saying the right words and her speech sounded thickened.  The symptoms have improved over the last 24 hours but she still struggles to find the right words to say and is speaking slower than normal she states.  She is able to move all of her extremities.  She reports she has not had any syncope, seizure activity, numbness of her face or extremities or drooping of her face.  Denies any chest pain, shortness of breath, palpitations, orthopnea, abdominal pain, nausea, vomiting, diarrhea, dysuria.  She has not had any fever or cough.  She initially went to [facility] ER and had a CT of the head that was negative and a CT a of the head and neck which showed no large vessel occlusions.  Patient was transferred to [facility] for MRI of the brain and neurology consult if the MRI is positive.  She reports that she was seeing a Scientist, research (medical) in the past but her case was stopped a year ago and she has not seen any physician since then.  She reports she used to take for 5 medications for diabetes and blood pressure but she has not been on any medication for the last year and she has not been to a doctor and had refills given.   She states she has not been checking her blood pressure or blood sugars at home.  She states she does not follow any particular diet.  He does report that she has used albuterol in the past for breathing but she is not sure if she was ever tested for or treated for COPD.  She lives with her sister.  She smokes a pack of cigarettes a day.  Review of Systems:  General: Reports dizziness. Denies fever, chills, weight loss, night sweats.  Denies change in appetite HENT: Denies head trauma, denies change in hearing, tinnitus.  Denies nasal congestion or bleeding.  Denies sore throat, sores in mouth.  Denies difficulty swallowing Eyes: Reports blurry vision which has resolved. Denies pain in eye, drainage.  Denies discoloration of eyes. Neck: Denies pain.  Denies swelling.  Denies pain with movement. Cardiovascular: Denies chest pain, palpitations.  Denies edema.  Denies orthopnea Respiratory: Denies shortness of breath, cough.  Denies wheezing.  Denies sputum production Gastrointestinal: Denies abdominal pain, swelling.  Denies nausea, vomiting, diarrhea.  Denies melena.  Denies hematemesis. Musculoskeletal: Denies limitation of movement. Denies deformity or swelling. Denies pain. Denies arthralgias or myalgias. Genitourinary: Denies pelvic pain.  Reports urinary frequency but no hesitancy.  Denies dysuria.  Skin: Denies rash.  Denies petechiae, purpura, ecchymosis. Neurological: Reports difficulty forming words and speaking. Reports headache.  Denies syncope.  Denies seizure  activity. Denies paresthesia.  Denies drooping face.  Denies visual change. Psychiatric: Denies depression, anxiety. Denies hallucinations.  History reviewed. No pertinent past medical history.  History reviewed. No pertinent surgical history.  Social History  reports that she has been smoking cigarettes. She started smoking about 46 years ago. She has been smoking about 0.50 packs per day. She has never used smokeless tobacco.  No history on file for alcohol use and drug use.  Allergies  Allergen Reactions  . Penicillins     History reviewed. No pertinent family history.   Prior to Admission medications   Medication Sig Start Date End Date Taking? Authorizing Provider  albuterol (PROVENTIL) (2.5 MG/3ML) 0.083% nebulizer solution Take 3 mLs (2.5 mg total) by nebulization every 4 (four) hours as needed for wheezing or shortness of breath. 02/06/19   Nyoka CowdenWert, Michael B, MD  Ascorbic Acid (VITAMIN C) 100 MG tablet Take 100 mg by mouth daily.    [provider]  aspirin 81 MG chewable tablet Chew 81 mg by mouth daily.    [provider]  benzonatate (TESSALON) 200 MG capsule TAKE 1 CAPSULE THREE TIMES DAILY AS NEEDED FOR COUGH. 03/28/19   Nyoka CowdenWert, Michael B, MD  Cholecalciferol (VITAMIN D3 PO) Take by mouth daily. 1000 mg    [provider]  cloNIDine (CATAPRES) 0.1 MG tablet Take 1 tablet (0.1 mg total) by mouth 2 (two) times daily. 02/06/19   Nyoka CowdenWert, Michael B, MD  dextromethorphan-guaiFENesin [facility](MUCINEX DM) 30-600 MG 12hr tablet 2 every 12 hours as needed for cough 02/27/19   Nyoka CowdenWert, Michael B, MD  fluticasone [facility](FLONASE) 50 MCG/ACT nasal spray USE 2 SPRAY(S) IN EACH NOSTRIL AT BEDTIME 01/17/19   [provider]  meclizine (ANTIVERT) 25 MG tablet Take 1 tablet by mouth every 8 (eight) hours as needed. 02/07/19   [provider]  metFORMIN (GLUCOPHAGE) 1000 MG tablet Take 1,000 mg by mouth 2 (two) times daily with a meal.    [provider]  ondansetron (ZOFRAN-ODT) 8 MG disintegrating tablet Take 1 tablet by mouth every 8 (eight) hours as needed. 02/07/19   [provider]  pantoprazole (PROTONIX) 40 MG tablet Take 30- 60 min before your first and last meals of the day 02/21/19   Nyoka CowdenWert, Michael B, MD  POTASSIUM PO Take 1 tablet by mouth daily. 99 mg    [provider]  Respiratory Therapy Supplies (FLUTTER) DEVI Use as directed 02/21/19   Nyoka CowdenWert, Michael B, MD  zinc  gluconate 50 MG tablet Take 50 mg by mouth daily.    [provider]    Physical Exam: There were no vitals filed for this visit.  Constitutional: NAD, calm, comfortable There were no vitals filed for this visit. General: WDWN, Alert and oriented x3.  Eyes: EOMI, PERRL, conjunctivae normal.  Sclera nonicteric HENT:  /AT, external ears normal.  Nares patent without epistasis.  Mucous membranes are moist.  Neck: Soft, normal range of motion, supple, no masses, no thyromegaly. Trachea midline Respiratory: Equal breath sounds with diffuse scattered rales, moderate expiratory wheezing, no crackles. Normal respiratory effort. No accessory muscle use.  Cardiovascular: Regular rate and rhythm, no murmurs / rubs / gallops. No extremity edema. 2+ pedal pulses. No carotid bruits.  Abdomen: Soft, no tenderness, nondistended, no rebound or guarding.  Obese.  No masses palpated. Bowel sounds normoactive Musculoskeletal: FROM.  Has clubbing of digits.  No cyanosis. No joint deformity upper and lower extremities. Normal muscle tone.  Skin: Warm, dry, intact no rashes,  lesions, ulcers. No induration Neurologic: CN 2-12 grossly intact.  Speech is slow and patient struggles to find words at times.  Sensation intact, patella DTR +1 bilaterally. Strength 5/5 in all extremities.  No pronator drift.  Normal heel-to-shin. Psychiatric:   Normal mood.  Poor insight into medical conditions health   Labs on Admission: I have personally reviewed following labs and imaging studies  CBC: No results for input(s): WBC, NEUTROABS, HGB, HCT, MCV, PLT in the last 168 hours.  Basic Metabolic Panel: No results for input(s): NA, K, CL, CO2, GLUCOSE, BUN, CREATININE, CALCIUM, MG, PHOS in the last 168 hours.  GFR: CrCl cannot be calculated (Patient's most recent lab result is older than the maximum 21 days allowed.).  Liver Function Tests: No results for input(s): AST, ALT, ALKPHOS, BILITOT, PROT, ALBUMIN in the  last 168 hours.  Urine analysis: No results found for: COLORURINE, APPEARANCEUR, LABSPEC, PHURINE, GLUCOSEU, HGBUR, BILIRUBINUR, KETONESUR, PROTEINUR, UROBILINOGEN, NITRITE, LEUKOCYTESUR  Radiological Exams on Admission: No results found.  EKG: Done at [facility] emergency room but was not sent with pt.   Assessment/Plan Principal Problem:   Expressive aphasia Patient be admitted on telemetry floor for TIA/CVA.  Will obtain MRI of brain to rule out acute ischemic CVA.  Pt had CT head which showed no acute pathology and CTA head and neck with no significant large vessel occlusion at [facility] ER.  Obtain echocardiogram to evaluate for PFO, wall motion and ejection fraction. Pt has been over 24 hours since symptom onset and will initiate antihypertensives to bring BP to controlled range. Antiplatelet therapy with aspirin daily. Check lipid panel. Will start statin once lipid values known and if MRI positive. If Mri positive for CVA then pt needs high dose statin with lipitor 80 mg based on [place] study which showed high dose lipitor decreases recurrent stroke. Neurochecks per stroke protocol  Active Problems:   Diabetes mellitus type 2, uncontrolled  Ms. Berman states she is not taking any therapy for her diabetes in over a year.  She states that the doctor she was seeing stop seeing her and she has not been able to get refills of medications.  She reports has not been checking her blood sugars at home over the last year. Monitor blood sugars with meals and at bedtime.  Sliding scale insulin provided for glycemic control. Patient had CT angiography last night at the emergency room and therefore cannot begin Metformin until 48 hours after the CTA was performed.  Reviewing chart reveals patient was on Metformin in the past Check hemoglobin A1c Initiate ACE inhibitor therapy for renal protection and blood pressure control    Essential hypertension Start lisinopril 10 mg a day and  hydrochlorothiazide 12.5 mg a day.  Monitor blood pressure Blood pressure goal is less than 135/85    Cigarette smoker Nicotine patch to prevent withdrawal.  Smoke cessation education to be provided before discharge     Suspect patient has COPD although she says she is never been diagnosed with it.  She states she has used albuterol MDI and nebulizers in the past.  DuoNebs every 6 hours as needed for shortness of breath, cough, wheeze. Patient will need spirometry as an outpatient to await lung function    DVT prophylaxis: SCDs for DVT prophylaxis Code Status:   Full code  Family Communication:  Diagnosis and plan discussed with patient.  Patient verbalized understanding.  Questions answered.  Further recommendations to follow as clinical indicated Disposition Plan:   Patient is  from:  Home  Anticipated DC to:  Home  Anticipated DC date:  Anticipate 2 midnight stay to treat acute medical condition  Anticipated DC barriers: No barriers to discharge identified at this time  Admission status:  Inpatient   Claudean Severance Chotiner MD [facility]  How to contact the [facility] Attending or Consulting provider 7A - 7P or covering provider during after hours 7P -7A, for this patient?   1. Check the care team in [facility] and look for a) attending/consulting [facility] provider listed and b) the [facility] team listed 2. Log into [email or web address] and use [facility] universal password to access. If you do not have the password, please contact the hospital operator. 3. Locate the [facility] provider you are looking for under [facility] and page to a number that you can be directly reached. 4. If you still have difficulty reaching the provider, please page the [facility] (Director on Call) for the Hospitalists listed on amion for assistance.  11/16/2020, 8:07 PM

## 2020-11-17 ENCOUNTER — Inpatient Hospital Stay (HOSPITAL_COMMUNITY): Payer: Medicaid Other

## 2020-11-17 DIAGNOSIS — I10 Essential (primary) hypertension: Secondary | ICD-10-CM

## 2020-11-17 DIAGNOSIS — J449 Chronic obstructive pulmonary disease, unspecified: Secondary | ICD-10-CM

## 2020-11-17 DIAGNOSIS — R4701 Aphasia: Secondary | ICD-10-CM

## 2020-11-17 LAB — GLUCOSE, CAPILLARY
Glucose-Capillary: 194 mg/dL — ABNORMAL HIGH (ref 70–99)
Glucose-Capillary: 209 mg/dL — ABNORMAL HIGH (ref 70–99)
Glucose-Capillary: 245 mg/dL — ABNORMAL HIGH (ref 70–99)
Glucose-Capillary: 357 mg/dL — ABNORMAL HIGH (ref 70–99)

## 2020-11-17 LAB — URINALYSIS, ROUTINE W REFLEX MICROSCOPIC
Bacteria, UA: NONE SEEN
Bilirubin Urine: NEGATIVE
Glucose, UA: 150 mg/dL — AB
Ketones, ur: NEGATIVE mg/dL
Leukocytes,Ua: NEGATIVE
Nitrite: NEGATIVE
Protein, ur: 100 mg/dL — AB
Specific Gravity, Urine: 1.016 (ref 1.005–1.030)
pH: 5 (ref 5.0–8.0)

## 2020-11-17 LAB — ECHOCARDIOGRAM COMPLETE
AR max vel: 3.85 cm2
AV Area VTI: 4.2 cm2
AV Area mean vel: 4 cm2
AV Mean grad: 4 mmHg
AV Peak grad: 7.8 mmHg
Ao pk vel: 1.4 m/s
Area-P 1/2: 4.01 cm2
Height: 66 in
MV VTI: 3.56 cm2
S' Lateral: 3.6 cm
Weight: 3985.92 oz

## 2020-11-17 LAB — LIPID PANEL
Cholesterol: 190 mg/dL (ref 0–200)
HDL: 24 mg/dL — ABNORMAL LOW (ref >40)
LDL Cholesterol: 117 mg/dL — ABNORMAL HIGH (ref 0–99)
Total CHOL/HDL Ratio: 7.9 RATIO
Triglycerides: 244 mg/dL — ABNORMAL HIGH (ref <150)
VLDL: 49 mg/dL — ABNORMAL HIGH (ref 0–40)

## 2020-11-17 IMAGING — MR MR HEAD WO/W CM
7 of 13 series · 22 of 48 positions shown · IV contrast (Yes GAD)
Comparison: Limited study earlier same day.  CT studies [DATE].

CLINICAL DATA: Word finding difficulty. Negative limited study
earlier today.

EXAM:
MRI HEAD WITHOUT AND WITH CONTRAST
TECHNIQUE: Multiplanar, multiecho pulse sequences of the brain and surrounding
structures were obtained without and with intravenous contrast.
CONTRAST:  10mL GADAVIST GADOBUTROL 1 MMOL/ML IV SOLN

[Series 2: DWI · axial · 3.0mm · 0.94mm/px · z∈[-58,+95]mm · 6 of 106 slices shown (1 of 2)]
[im 1/106]
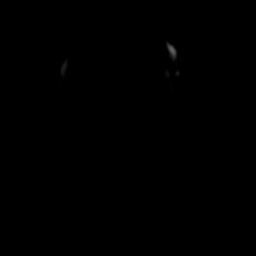
[im 22/106]
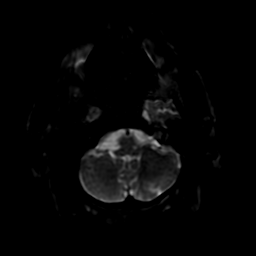
[im 43/106]
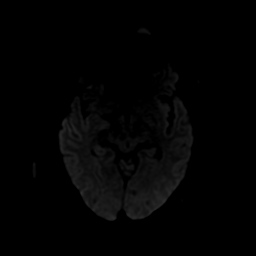
[im 64/106]
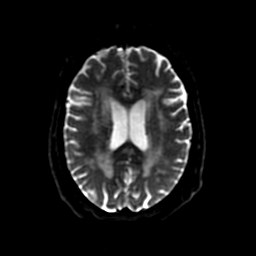
[im 85/106]
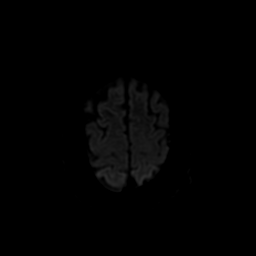
[im 106/106]
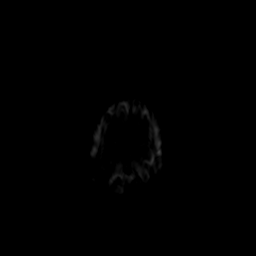

[Series 3: DWI · coronal · 4.0mm · 0.94mm/px · 4 of 74 slices shown (2 of 2)]
[im 1/74]
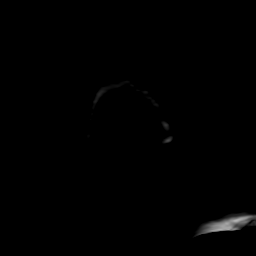
[im 25/74]
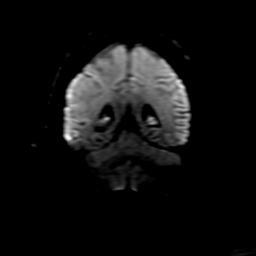
[im 49/74]
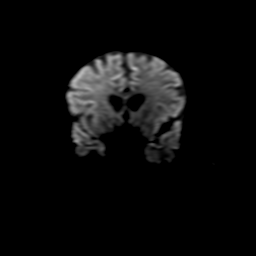
[im 74/74]
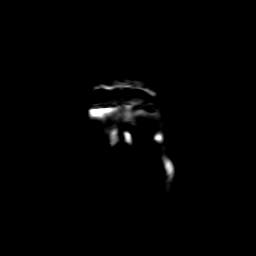

[Series 4: FLAIR · sagittal · 5.0mm · 0.23mm/px · 1 of 23 slices shown (1 of 2)]
[im 1/23]
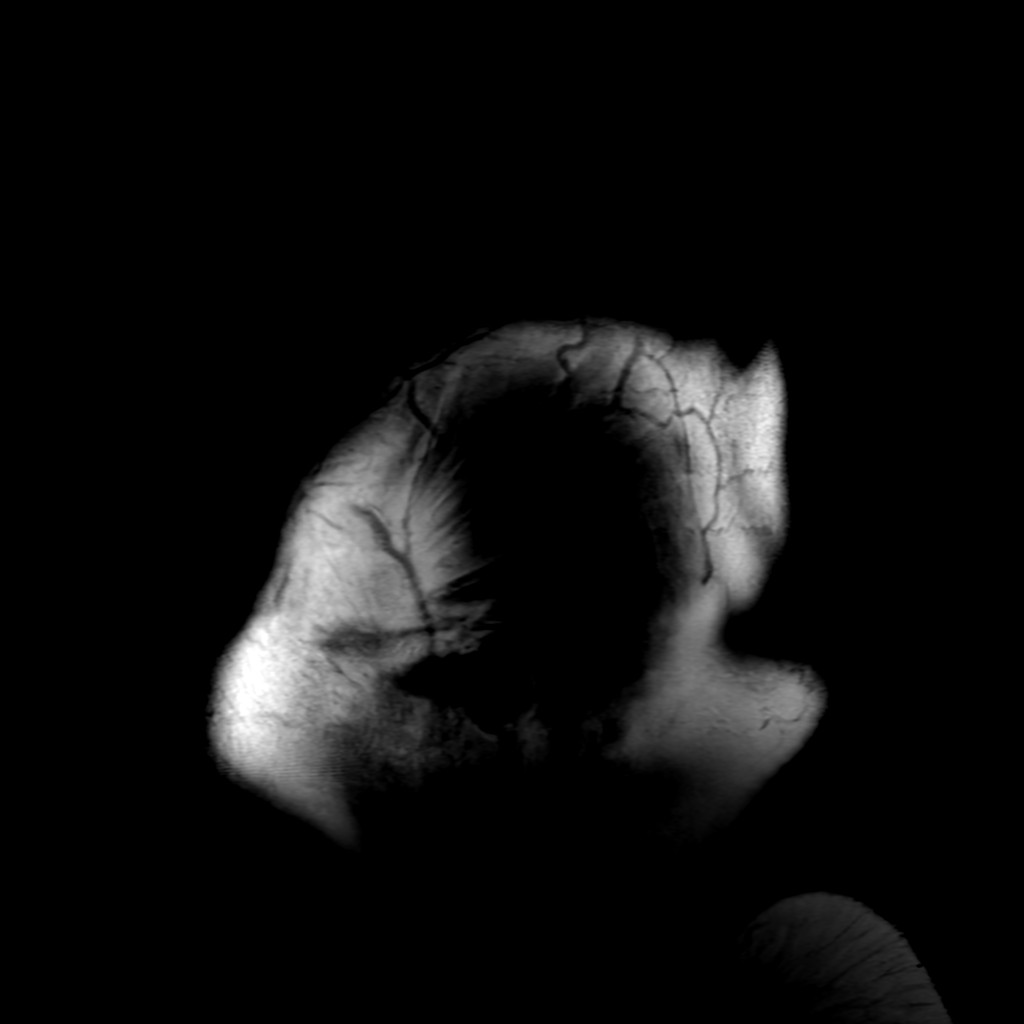

[Series 5: T2 · axial · 5.0mm · 0.23mm/px · z∈[-57,+90]mm · 2 of 26 slices shown]
[im 1/26]
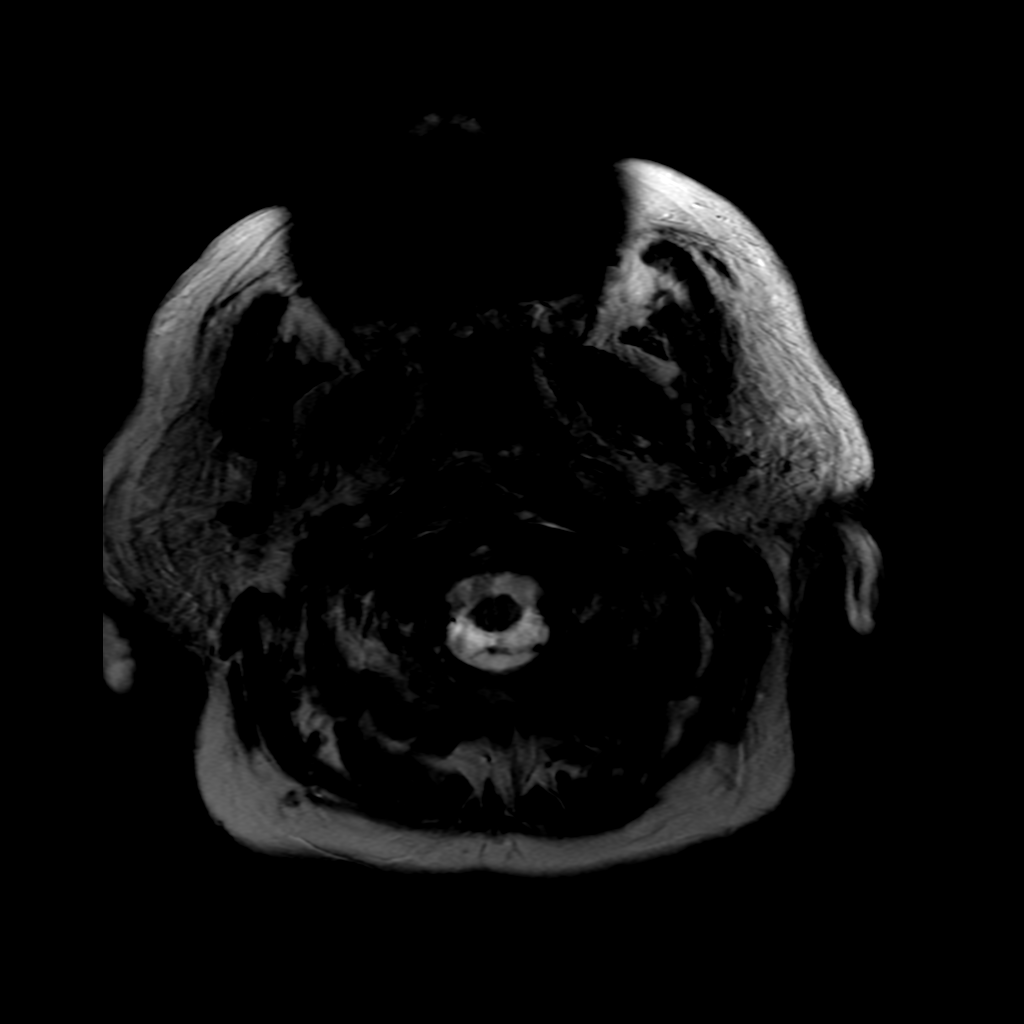
[im 26/26]
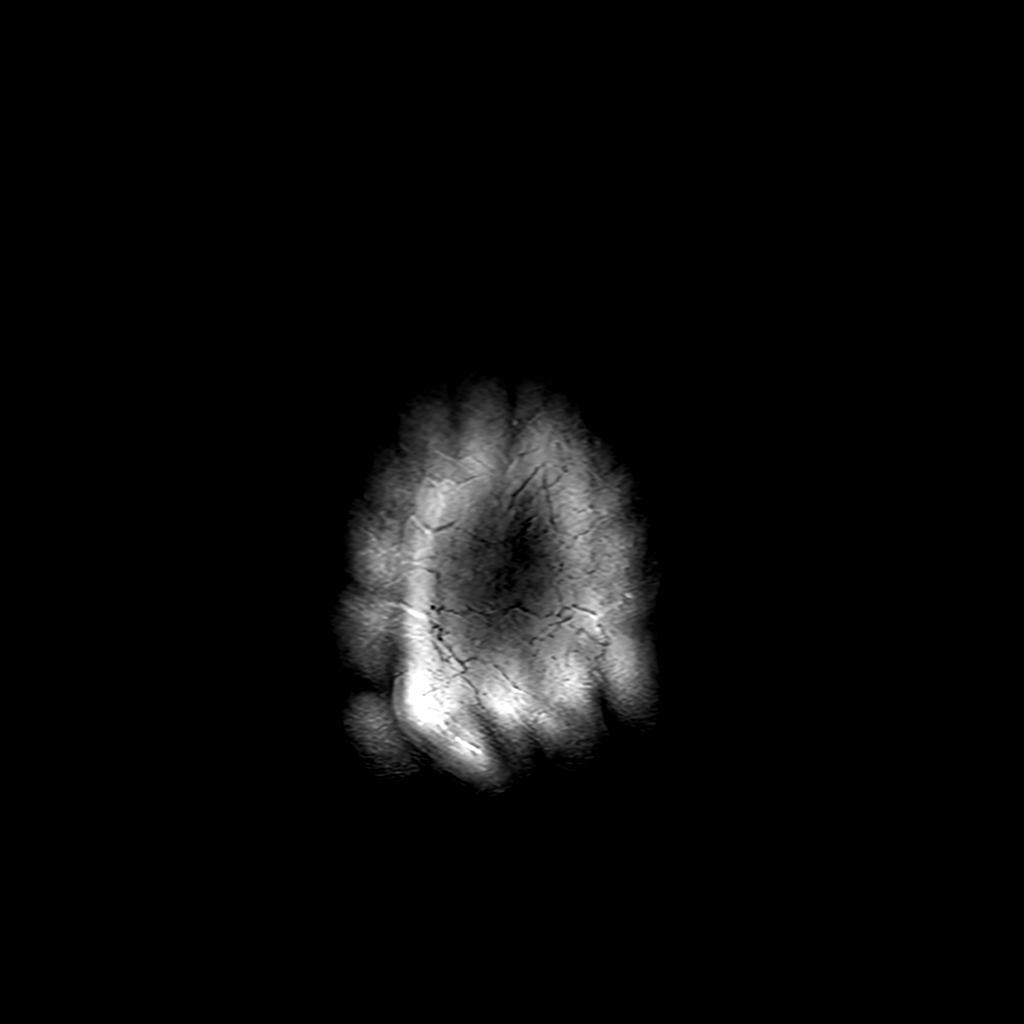

[Series 11: FLAIR · axial · 3.0mm · 0.47mm/px · z∈[-57,+90]mm · 2 of 26 slices shown (2 of 2)]
[im 1/26]
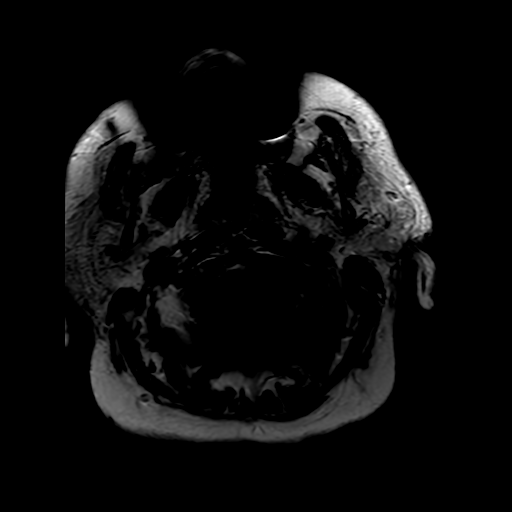
[im 26/26]
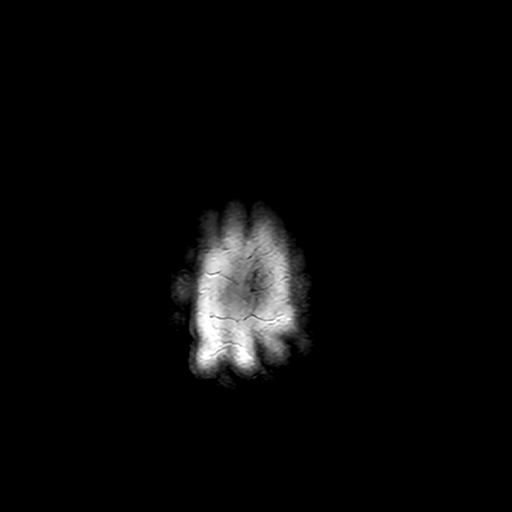

[Series 250: ADC · axial · 3.0mm · 0.94mm/px · z∈[-58,+95]mm · 4 of 53 slices shown (1 of 2)]
[im 1/53]
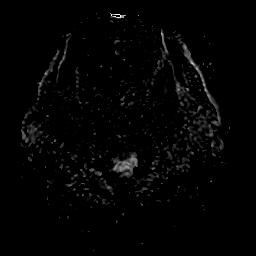
[im 18/53]
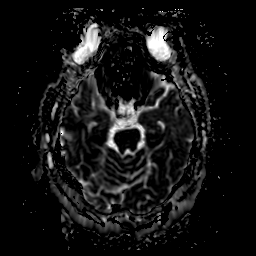
[im 35/53]
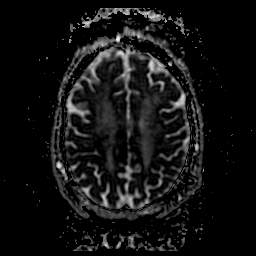
[im 53/53]
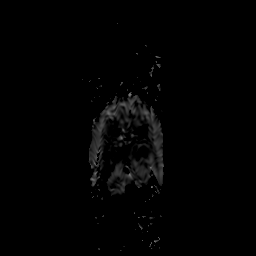

[Series 350: ADC · coronal · 4.0mm · 0.94mm/px · 3 of 37 slices shown (2 of 2)]
[im 1/37]
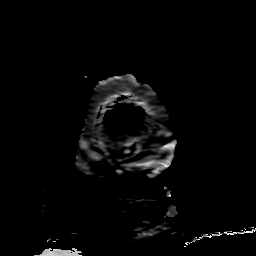
[im 19/37]
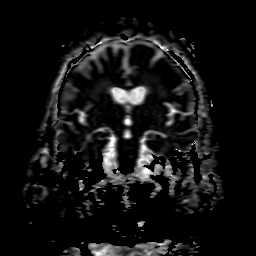
[im 37/37]
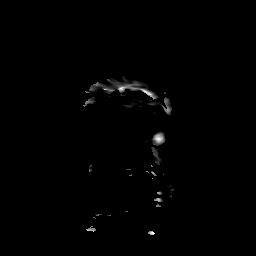

[22 of 48 positions shown; findings below may reference images not displayed]

FINDINGS: Brain: This examination also suffers from motion degradation. There
is a definite acute punctate infarction at the left frontal
gray-white junction. Question 1 or 2 other punctate foci of
restricted diffusion in the left frontal operculum and radiating
white matter tracts on the left. Those are not certain. Elsewhere,
chronic small-vessel ischemic changes affect the pons. No focal
cerebellar abnormality is seen. Moderate to advanced chronic
small-vessel ischemic changes affect the cerebral hemispheric white
matter. After contrast administration, there is a small focus of
enhancement in the deep insula on the left probably corresponding to
a late acute/subacute infarction in that location. Similarly, low
level enhancement is seen in the radiating white matter tracts on
the left probably representing late acute/subacute infarction. No
other abnormal enhancement is seen. No sign of mass, hemorrhage,
hydrocephalus or extra-axial collection.

Vascular: Major vessels at the base of the brain show flow.

Skull and upper cervical spine: Negative

Sinuses/Orbits: Clear/normal

Other: None
IMPRESSION: 1. Severely motion degraded exam.
2. Punctate acute infarction at the left frontal gray-white
junction. Question few other punctate foci of restricted diffusion
in the left frontal operculum, deep insula and radiating white
matter tracts on the left.
3. Low level enhancement in the deep insula and radiating white
matter tracts on the left, probably indicating that these are late
acute or subacute infarctions. Given the acute infarction in the
left frontal lobe, punctate infarctions of differing ages suggest
recurrent embolic infarctions, possibly from the left carotid
bifurcation in this case.
4. Moderate to advanced chronic small-vessel ischemic changes
elsewhere throughout the brain.

## 2020-11-17 IMAGING — MR MR HEAD W/O CM
6 series · 48 of 48 positions shown · non-contrast
Comparison: None.

CLINICAL DATA: Acute neurologic deficit

EXAM:
MRI HEAD WITHOUT CONTRAST
TECHNIQUE: Multiplanar, multiecho pulse sequences of the brain and surrounding
structures were obtained without intravenous contrast.

[Series 5: DWI · axial · 3.0mm · 0.88mm/px · z∈[-103,+44]mm · 16 of 100 slices shown (1 of 4)]
[im 1/100]
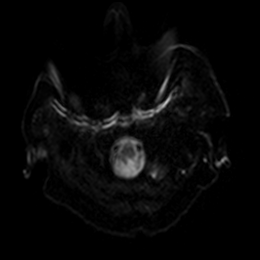
[im 7/100]
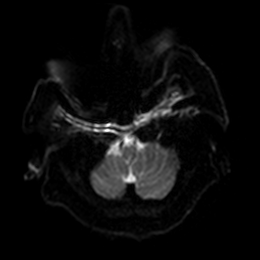
[im 14/100]
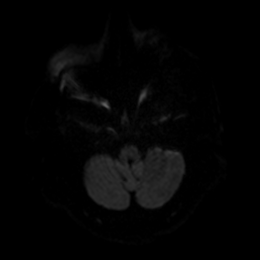
[im 20/100]
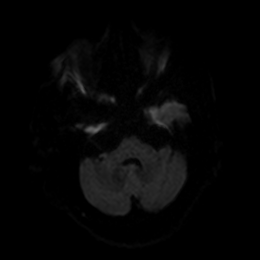
[im 27/100]
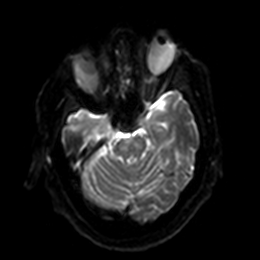
[im 34/100]
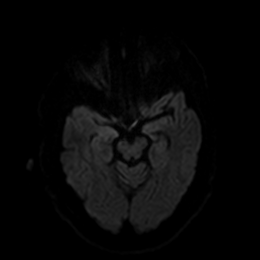
[im 40/100]
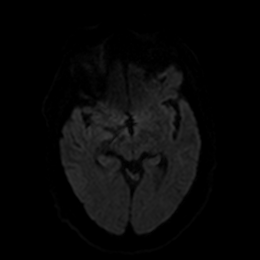
[im 47/100]
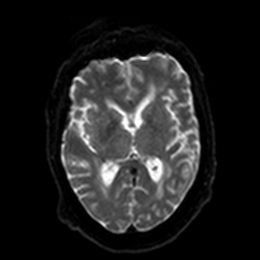
[im 53/100]
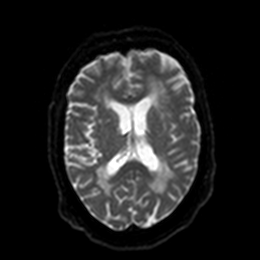
[im 60/100]
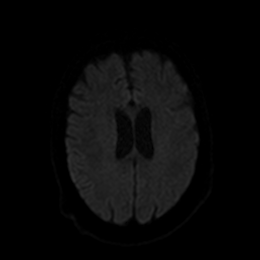
[im 67/100]
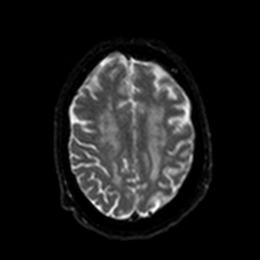
[im 73/100]
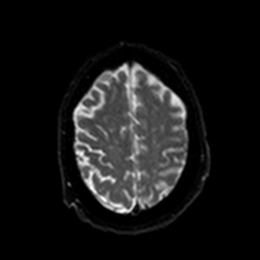
[im 80/100]
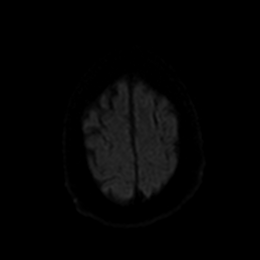
[im 86/100]
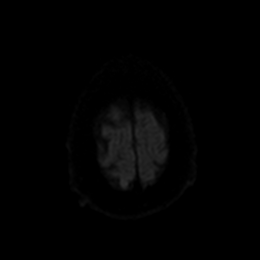
[im 93/100]
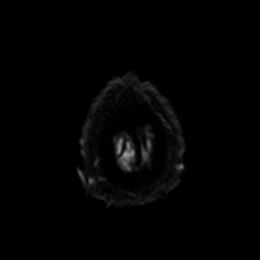
[im 100/100]
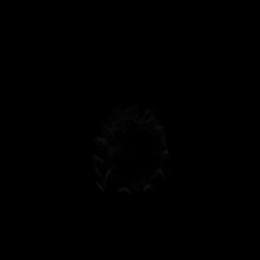

[Series 6: DWI · axial · 3.0mm · 0.88mm/px · z∈[-103,+44]mm · 8 of 50 slices shown (2 of 4)]
[im 1/50]
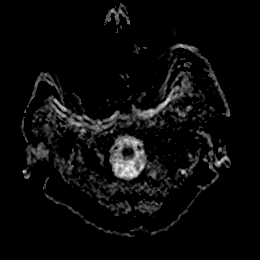
[im 8/50]
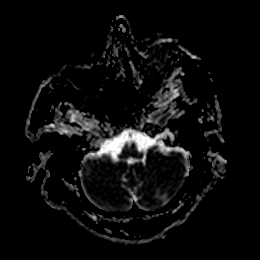
[im 15/50]
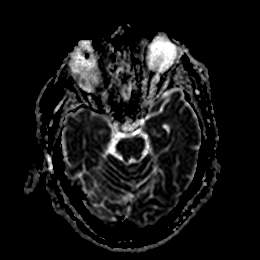
[im 22/50]
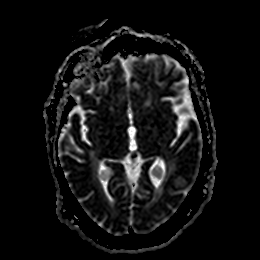
[im 29/50]
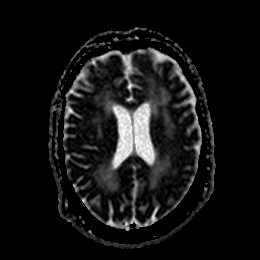
[im 36/50]
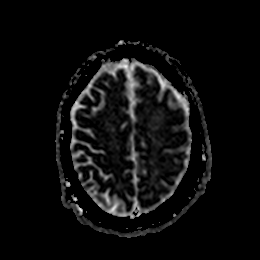
[im 43/50]
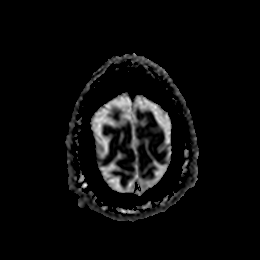
[im 50/50]
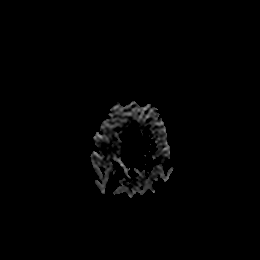

[Series 7: DWI · coronal · 4.0mm · 0.88mm/px · 11 of 66 slices shown (3 of 4)]
[im 1/66]
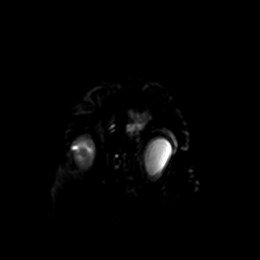
[im 7/66]
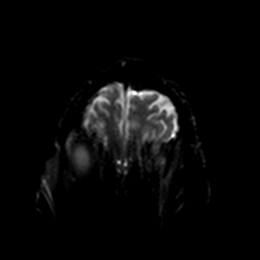
[im 14/66]
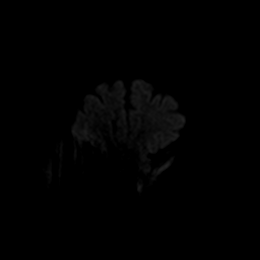
[im 20/66]
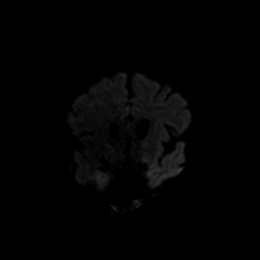
[im 27/66]
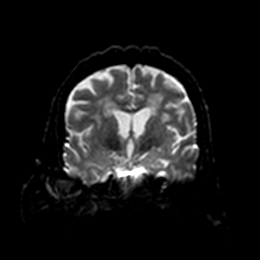
[im 33/66]
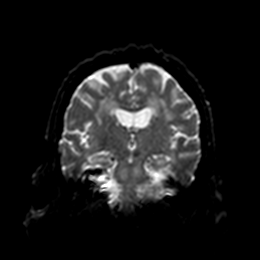
[im 40/66]
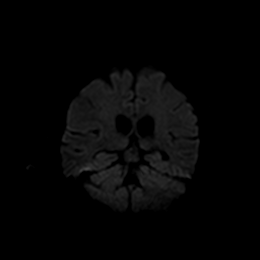
[im 46/66]
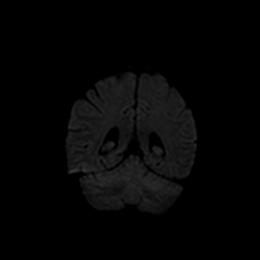
[im 53/66]
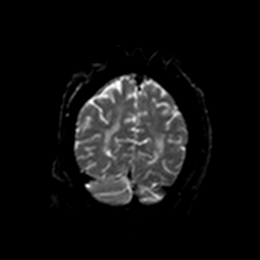
[im 59/66]
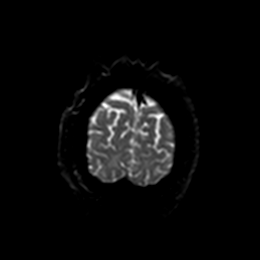
[im 66/66]
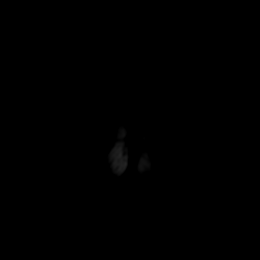

[Series 8: DWI · coronal · 4.0mm · 0.88mm/px · 5 of 33 slices shown (4 of 4)]
[im 1/33]
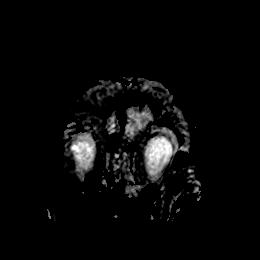
[im 9/33]
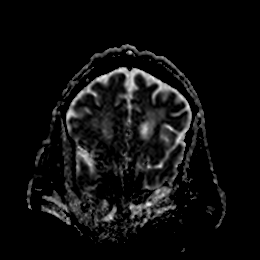
[im 17/33]
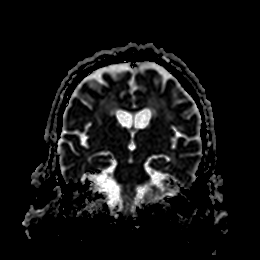
[im 25/33]
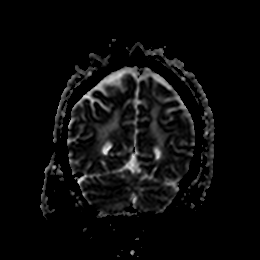
[im 33/33]
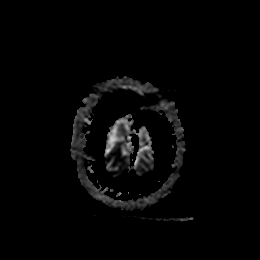

[Series 9: T1 · sagittal · 5.0mm · 0.75mm/px · 4 of 25 slices shown]
[im 1/25]
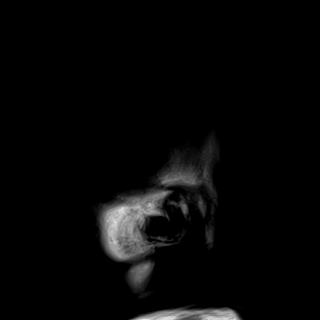
[im 9/25]
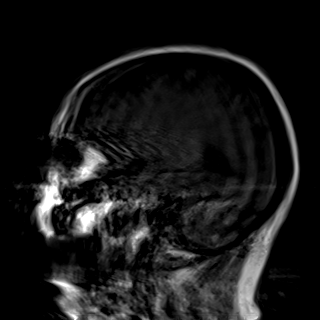
[im 17/25]
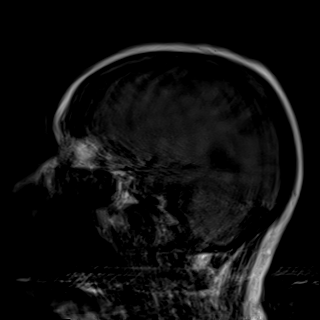
[im 25/25]
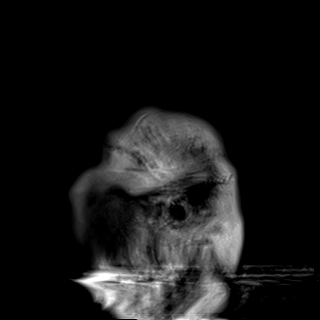

[Series 10: T2 · axial · 5.0mm · 0.72mm/px · z∈[-99,+45]mm · 4 of 25 slices shown]
[im 1/25]
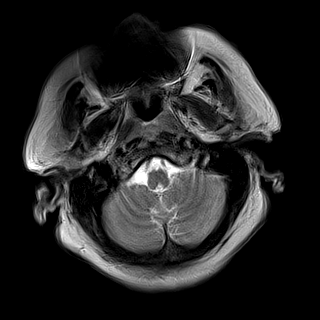
[im 9/25]
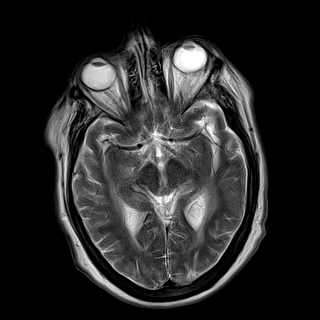
[im 17/25]
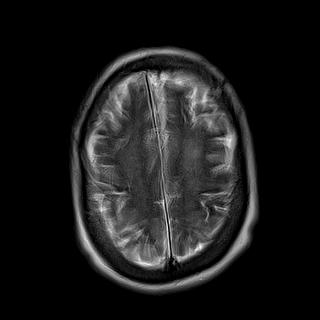
[im 25/25]
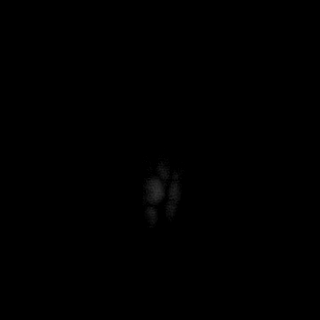

[48 of 48 positions shown; findings below may reference images not displayed]

FINDINGS: The complete examination protocol was not finished. There are 6
series provided for interpretation.

There is no acute infarct. No midline shift or other mass effect.
There is moderate white matter disease.
IMPRESSION: Truncated examination. No acute infarct.

## 2020-11-17 MED ORDER — INFLUENZA VAC SPLIT QUAD 0.5 ML IM SUSY
0.5000 mL | PREFILLED_SYRINGE | INTRAMUSCULAR | Status: AC
  Administered 2020-11-18: 0.5 mL via INTRAMUSCULAR
  Filled 2020-11-17: qty 0.5

## 2020-11-17 MED ORDER — ATORVASTATIN CALCIUM 40 MG PO TABS
40.0000 mg | ORAL_TABLET | Freq: Every day | ORAL | Status: DC
  Administered 2020-11-17 – 2020-11-18 (×2): 40 mg via ORAL
  Filled 2020-11-17 (×2): qty 1

## 2020-11-17 MED ORDER — GADOBUTROL 1 MMOL/ML IV SOLN
10.0000 mL | Freq: Once | INTRAVENOUS | Status: AC | PRN
  Administered 2020-11-17: 10 mL via INTRAVENOUS

## 2020-11-17 MED ORDER — PNEUMOCOCCAL VAC POLYVALENT 25 MCG/0.5ML IJ INJ
0.5000 mL | INJECTION | INTRAMUSCULAR | Status: AC
  Administered 2020-11-18: 0.5 mL via INTRAMUSCULAR
  Filled 2020-11-17: qty 0.5

## 2020-11-17 MED ORDER — INSULIN STARTER KIT- PEN NEEDLES (ENGLISH)
1.0000 | Freq: Once | Status: AC
  Administered 2020-11-17: 1
  Filled 2020-11-17: qty 1

## 2020-11-17 MED ORDER — INSULIN GLARGINE 100 UNIT/ML ~~LOC~~ SOLN
15.0000 [IU] | Freq: Every day | SUBCUTANEOUS | Status: DC
  Administered 2020-11-17 – 2020-11-18 (×2): 15 [IU] via SUBCUTANEOUS
  Filled 2020-11-17 (×2): qty 0.15

## 2020-11-17 MED ORDER — LIVING WELL WITH DIABETES BOOK
Freq: Once | Status: AC
  Filled 2020-11-17: qty 1

## 2020-11-17 MED ORDER — LORAZEPAM 2 MG/ML IJ SOLN
1.0000 mg | Freq: Once | INTRAMUSCULAR | Status: DC | PRN
  Administered 2020-11-17: 1 mg via INTRAVENOUS
  Filled 2020-11-17: qty 1
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
## 2020-11-17 NOTE — Evaluation (Signed)
Occupational Therapy Evaluation Patient Details Name: Olivia Bell MRN: [id number] DOB: 01/26/1956 Today's Date: 11/17/2020    History of Present Illness 65 yo female presenting from [facility] with aphasia and several days of "not feeling right". MRI showing no acute infarct. PMH: HTN, COPD, DM2, smoker, TBI in 2021.   Clinical Impression   PTA, pt was living with her sister and was independent with BADLs and IADLs; use of cane and difficulty with heavy IADLs due to fatigue. Pt currently requiring Min A for LB ADLs, toileting, and functional mobility due to poor balance and activity tolerance. Pt presenting with episode of LOB posteriorly and requiring Min A for fall prevention and balance correction. Pt would benefit from further acute OT to facilitate safe dc. Recommend dc to home with HHOT for further OT to optimize safety, independence with ADLs, and return to PLOF.     Follow Up Recommendations  Home health OT;Supervision/Assistance - 24 hour    Equipment Recommendations  3 in 1 bedside commode;Tub/shower bench    Recommendations for Other Services PT consult;Speech consult     Precautions / Restrictions Precautions Precautions: Fall Precaution Comments:  Restrictions Weight Bearing Restrictions: No      Mobility Bed Mobility Overal bed mobility: Needs Assistance Bed Mobility: Supine to Sit     Supine to sit: Supervision     General bed mobility comments: OOB in recliner upon arrival    Transfers Overall transfer level: Needs assistance Equipment used: Rolling walker (2 wheeled);Straight cane Transfers: Sit to/from [employer] Sit to Stand: Min assist Stand pivot transfers: Min assist       General transfer comment: Standing with Min Guard A for safety and then requiring Min A for correction of posterior lean    Balance Overall balance assessment: Needs assistance;History of Falls Sitting-balance support: Feet supported;No upper  extremity supported Sitting balance-Leahy Scale: Good     Standing balance support: Bilateral upper extremity supported;During functional activity;No upper extremity supported Standing balance-Leahy Scale: Poor Standing balance comment: maintaining standing at sink without UE support; however moments of posterior lean with need for physical A                           ADL either performed or assessed with clinical judgement   ADL Overall ADL's : Needs assistance/impaired Eating/Feeding: Set up;Sitting   Grooming: Oral care;Minimal assistance;Min guard;Standing Grooming Details (indicate cue type and reason): Pt performing oral care at sink with Min Guard A for safety; episode of posterior LOB with Min A for fall prevention. Pt sequencing and performing oral care without difficulty Upper Body Bathing: Supervision/ safety;Set up;Sitting   Lower Body Bathing: Minimal assistance;Sit to/from stand   Upper Body Dressing : Supervision/safety;Set up;Sitting   Lower Body Dressing: Minimal assistance;Sit to/from stand   Toilet Transfer: Minimal assistance;Ambulation;RW (simulated to recliner)   Toileting- Clothing Manipulation and Hygiene: Minimal assistance;Sit to/from stand       Functional mobility during ADLs: Minimal assistance;Rolling walker General ADL Comments: Pt presenting with decreased processing and balance.     Vision Baseline Vision/History: Wears glasses Wears Glasses: Reading only Patient Visual Report: No change from baseline Additional Comments: Noting visual fatigue during tracking. Able to track all quad     Perception     Praxis      Pertinent Vitals/Pain Pain Assessment: Faces Faces Pain Scale: Hurts a little bit Pain Location: L hand, hematoma noted dorsal hand Pain Descriptors / Indicators: Sore Pain Intervention(s):  Monitored during session;Limited activity within patient's tolerance;Repositioned     Hand Dominance Right    Extremity/Trunk Assessment Upper Extremity Assessment Upper Extremity Assessment: RUE deficits/detail;LUE deficits/detail RUE Deficits / Details: Slower processing and requiring increased time for FM coorindation during test. [facility] for grooming at sink RUE Coordination: decreased fine motor LUE Deficits / Details: Slightly weaker grasp compared to R. Slower processing and requiring increased time for FM coorindation during test. [facility] for grooming at sink LUE Coordination: decreased fine motor   Lower Extremity Assessment Lower Extremity Assessment: Defer to PT evaluation RLE Deficits / Details: strength WFL but pt reports beginning neuropathy as well as trophic changes B feet RLE Sensation: history of peripheral neuropathy;decreased proprioception RLE Coordination: decreased gross motor LLE Deficits / Details: same as RLE LLE Sensation: decreased proprioception;history of peripheral neuropathy LLE Coordination: decreased gross motor   Cervical / Trunk Assessment Cervical / Trunk Assessment: Normal   Communication Communication Communication: Expressive difficulties (word finding)   Cognition Arousal/Alertness: Awake/alert Behavior During Therapy: WFL for tasks assessed/performed Overall Cognitive Status: Impaired/Different from baseline Area of Impairment: Problem solving;Safety/judgement                         Safety/Judgement: Decreased awareness of deficits;Decreased awareness of safety   Problem Solving: Slow processing General Comments: Requiring increased time for processing   General Comments  O2 sats with 2L O2 93%, 91% on RA. HR 78-90 bpm. BP after standing 148/73    Exercises     Shoulder Instructions      Home Living Family/patient expects to be discharged to:: Private residence Living Arrangements: Other relatives (Sister - with advanced type 2 DM) Available Help at Discharge: Family;Available 24 hours/day Type of Home: House Home Access: Ramped  entrance     Home Layout: Two level;Able to live on main level with bedroom/bathroom Alternate Level Stairs-Number of Steps: flight with chair lift   Bathroom Shower/Tub: Tub/shower unit   Bathroom Toilet: Standard     Home Equipment: Cane - single point (chair lift)   Additional Comments: sister and brother in law with her. Not good relationship with brother in law. Sister can supervise but has health issues and can't help physically      Prior Functioning/Environment Level of Independence: Independent with assistive device(s)        Comments: Pt performs ADLs and IADLs; difficulty with home management due to fatigue. uses cane. Not very mobile in community, drives but does drive through and pick up        OT Problem List: Decreased strength;Decreased range of motion;Decreased activity tolerance;Impaired balance (sitting and/or standing);Impaired vision/perception;Decreased coordination;Decreased cognition;Decreased safety awareness;Decreased knowledge of use of DME or AE;Pain;Obesity      OT Treatment/Interventions: Self-care/ADL training;Therapeutic exercise;Energy conservation;Therapeutic activities;Patient/family education;DME and/or AE instruction    OT Goals(Current goals can be found in the care plan section) Acute Rehab OT Goals Patient Stated Goal: return home OT Goal Formulation: With patient Time For Goal Achievement: 12/01/20 Potential to Achieve Goals: Good  OT Frequency: Min 2X/week   Barriers to D/C:            Co-evaluation              AM-PAC OT "6 Clicks" Daily Activity     Outcome Measure Help from another person eating meals?: None Help from another person taking care of personal grooming?: A Little Help from another person toileting, which includes using toliet, bedpan, or urinal?: A Little Help from another  person bathing (including washing, rinsing, drying)?: A Little Help from another person to put on and taking off regular upper body  clothing?: None Help from another person to put on and taking off regular lower body clothing?: A Little 6 Click Score: 20   End of Session Equipment Utilized During Treatment: Rolling walker Nurse Communication: Mobility status  Activity Tolerance: Patient tolerated treatment well Patient left: in chair;with call bell/phone within reach;with chair alarm set  OT Visit Diagnosis: Other abnormalities of gait and mobility (R26.89);Unsteadiness on feet (R26.81);Muscle weakness (generalized) (M62.81);Pain;Cognitive communication deficit (G83.662)                Time: 9476-5465 OT Time Calculation (min): 24 min Charges:  OT General Charges $OT Visit: 1 Visit OT Evaluation $OT Eval Moderate Complexity: 1 Mod OT Treatments $Self Care/Home Management : 8-22 mins  Mayzee Reichenbach MSOT, OTR/L Acute Rehab Pager: [phone] Office: [phone]  Theodoro Grist Lennis Korb 11/17/2020, 12:54 PM

## 2020-11-17 NOTE — Procedures (Signed)
Patient Name: Olivia Bell  MRN: [id number]  Epilepsy Attending: Charlsie Quest  Referring Physician/Provider: Dr. Lacretia Nicks Date: 11/17/2020 Duration: 24.18 mins  Patient history: 65 year old female with speech disturbance.  EEG to evaluate for seizures.  Level of alertness: Awake, asleep  AEDs during EEG study: None  Technical aspects: This EEG study was done with scalp electrodes positioned according to the 10-20 International system of electrode placement. Electrical activity was acquired at a sampling rate of 500Hz  and reviewed with a high frequency filter of 70Hz  and a low frequency filter of 1Hz . EEG data were recorded continuously and digitally stored.   Description: The posterior dominant rhythm consists of 8.5 Hz activity of moderate voltage (25-35 uV) seen predominantly in posterior head regions, symmetric and reactive to eye opening and eye closing. Sleep was characterized by vertex waves, sleep spindles (12 to 14 Hz), maximal frontocentral region. EEG showed intermittent 3-4 Hz theta-delta slowing in left temporal region. Physiologic photic driving was not seen during photic stimulation.  Hyperventilation was not performed.     ABNORMALITY -Intermittent slow, left temporal region  IMPRESSION: This study is suggestive of nonspecific cortical dysfunction in the temporal region. No seizures or epileptiform discharges were seen throughout the recording.  Lanecia Sliva 

## 2020-11-17 NOTE — Evaluation (Signed)
Physical Therapy Evaluation Patient Details Name: Olivia Bell MRN: [id number] DOB: 1956/02/02 Today's Date: 11/17/2020   History of Present Illness  Pt present from [facility] for MRI of brain due to several day history of "not feeling right" followed by aphasia. MRI showed no acute infarct. PMH: HTN, COPD, DM2, smoker, TBI in 2021.  Clinical Impression  Pt admitted with above diagnosis. Pt presents with word finding difficulties during eval. She was also very dizzy when getting up and required UE support as well as min A. With a RW she was able to turn to recliner but did not ambulate further due to decreased safety and continued dizziness. BP 148/73 after standing. Pt reports she has been struggling with dizziness since head injury at work about 1 year ago.  Pt currently with functional limitations due to the deficits listed below (see PT Problem List). Pt will benefit from skilled PT to increase their independence and safety with mobility to allow discharge to the venue listed below.       Follow Up Recommendations Home health PT;Supervision for mobility/OOB    Equipment Recommendations  Rolling walker with 5" wheels    Recommendations for Other Services       Precautions / Restrictions Precautions Precautions: Fall Precaution Comments: pt reports she has not fallen except from waist height Restrictions Weight Bearing Restrictions: No      Mobility  Bed Mobility Overal bed mobility: Needs Assistance Bed Mobility: Supine to Sit     Supine to sit: Supervision     General bed mobility comments: pt able to come to EOB without physical assist from elevated HOB    Transfers Overall transfer level: Needs assistance Equipment used: Rolling walker (2 wheeled);Straight cane Transfers: Sit to/from [employer] Sit to Stand: Min assist Stand pivot transfers: Min assist       General transfer comment: pt first stood with [facility] and lost balance posterior and  returned to sitting. RW used next transfer and pt able to stand with min A for power up and maintain standing with UE support and continued min A. Pt reported dizziness. BP stable. Had difficulty stepping feet but was able to turn to chair with min A and RW  Ambulation/Gait             General Gait Details: deferred due to dizziness/ lack of safety  Stairs            Wheelchair Mobility    Modified Rankin (Stroke Patients Only) Modified Rankin (Stroke Patients Only) Pre-Morbid Rankin Score: Slight disability Modified Rankin: Moderately severe disability     Balance Overall balance assessment: Needs assistance;History of Falls Sitting-balance support: Feet supported;No upper extremity supported Sitting balance-Leahy Scale: Good     Standing balance support: Bilateral upper extremity supported;During functional activity Standing balance-Leahy Scale: Poor Standing balance comment: pt very unsteady in standing, required UE support and external support                             Pertinent Vitals/Pain Pain Assessment: Faces Faces Pain Scale: Hurts a little bit Pain Location: L hand, hematoma noted dorsal hand Pain Descriptors / Indicators: Sore Pain Intervention(s): Monitored during session    Home Living Family/patient expects to be discharged to:: Private residence Living Arrangements: Other relatives Available Help at Discharge: Family;Available 24 hours/day Type of Home: House Home Access: Ramped entrance     Home Layout: Two level;Able to live on main level with  bedroom/bathroom Home Equipment: Cane - single point (chair lift) Additional Comments: sister and brother in law with her. Not good relationship with brother in law. Sister can supervise but has health issues and can't help physically    Prior Function Level of Independence: Independent with assistive device(s)         Comments: uses cane. Not very mobile in community, drives but does  drive through and pick up     Hand Dominance   Dominant Hand: Right    Extremity/Trunk Assessment   Upper Extremity Assessment Upper Extremity Assessment: Defer to OT evaluation    Lower Extremity Assessment Lower Extremity Assessment: RLE deficits/detail;LLE deficits/detail RLE Deficits / Details: strength WFL but pt reports beginning neuropathy as well as trophic changes B feet RLE Sensation: history of peripheral neuropathy;decreased proprioception RLE Coordination: decreased gross motor LLE Deficits / Details: same as RLE LLE Sensation: decreased proprioception;history of peripheral neuropathy LLE Coordination: decreased gross motor    Cervical / Trunk Assessment Cervical / Trunk Assessment: Normal  Communication   Communication: Expressive difficulties (word finding)  Cognition Arousal/Alertness: Awake/alert Behavior During Therapy: WFL for tasks assessed/performed Overall Cognitive Status: Within Functional Limits for tasks assessed                                        General Comments General comments (skin integrity, edema, etc.): O2 sats with 2L O2 93%, 91% on RA. HR 78-90 bpm. BP after standing 148/73    Exercises     Assessment/Plan    PT Assessment Patient needs continued PT services  PT Problem List Decreased mobility;Decreased coordination;Decreased knowledge of use of DME;Decreased knowledge of precautions;Impaired sensation;Decreased activity tolerance;Decreased balance       PT Treatment Interventions DME instruction;Gait training;Functional mobility training;Therapeutic activities;Therapeutic exercise;Balance training;Patient/family education    PT Goals (Current goals can be found in the Care Plan section)  Acute Rehab PT Goals Patient Stated Goal: return home PT Goal Formulation: With patient Time For Goal Achievement: 12/01/20 Potential to Achieve Goals: Good    Frequency Min 3X/week   Barriers to discharge         Bell-evaluation               AM-PAC PT "6 Clicks" Mobility  Outcome Measure Help needed turning from your back to your side while in a flat bed without using bedrails?: None Help needed moving from lying on your back to sitting on the side of a flat bed without using bedrails?: None Help needed moving to and from a bed to a chair (including a wheelchair)?: A Lot Help needed standing up from a chair using your arms (e.g., wheelchair or bedside chair)?: A Little Help needed to walk in hospital room?: A Lot Help needed climbing 3-5 steps with a railing? : Total 6 Click Score: 16    End of Session Equipment Utilized During Treatment: Gait belt Activity Tolerance: Treatment limited secondary to medical complications (Comment) (dizziness) Patient left: in chair;with call bell/phone within reach (with MD and OT coming) Nurse Communication: Mobility status PT Visit Diagnosis: Unsteadiness on feet (R26.81);Difficulty in walking, not elsewhere classified (R26.2);Dizziness and giddiness (R42)    Time: 0102-7253 PT Time Calculation (min) (ACUTE ONLY): 31 min   Charges:   PT Evaluation $PT Eval Moderate Complexity: 1 Mod PT Treatments $Therapeutic Activity: 8-22 mins        Olivia Bell, PT  Acute Rehab Services  Pager [phone] Office [phone]   Benetta Spar L Jasmene Goswami 11/17/2020, 11:14 AM

## 2020-11-17 NOTE — Progress Notes (Signed)
Attempted, Pt is going for MRI

## 2020-11-17 NOTE — Progress Notes (Signed)
PROGRESS NOTE    Kimanh Templeman  PXT:[id number] DOB: 1955-12-06 DOA: 11/16/2020 PCP: Patient, No Pcp Per   No chief complaint on file.  Brief Narrative:  Olivia Bell is a 65 y.o. female with medical history significant for diabetes mellitus type II, hypertension, tobacco abuse who presents from [facility] ER in transfer for MRI of the brain.  Patient reports that she has not been feeling "right" the last 2 to 3 days.  She states this started as feeling like she was off balance and generalized weakness.  On Saturday, November 15, 2020 she laid down to take a nap in the afternoon and felt normal but when she woke up a few hours later she was not able to speak.  She states she had difficulty finding and saying the right words and her speech sounded thickened.  The symptoms have improved over the last 24 hours but she still struggles to find the right words to say and is speaking slower than normal she states.  She is able to move all of her extremities.  She reports she has not had any syncope, seizure activity, numbness of her face or extremities or drooping of her face.  Denies any chest pain, shortness of breath, palpitations, orthopnea, abdominal pain, nausea, vomiting, diarrhea, dysuria.  She has not had any fever or cough.  She initially went to [facility] ER and had a CT of the head that was negative and a CT a of the head and neck which showed no large vessel occlusions.  Patient was transferred to [facility] for MRI of the brain and neurology consult if the MRI is positive.  She reports that she was seeing a Risk analyst in the past but her case was stopped a year ago and she has not seen any physician since then.  She reports she used to take for 5 medications for diabetes and blood pressure but she has not been on any medication for the last year and she has not been to a doctor and had refills given.  She states she has not been checking her blood pressure or blood sugars  at home.  She states she does not follow any particular diet.  He does report that she has used albuterol in the past for breathing but she is not sure if she was ever tested for or treated for COPD.  She lives with her sister.  She smokes a pack of cigarettes a day.  Assessment & Plan:   Principal Problem:   Expressive aphasia Active Problems:   Essential hypertension   Cigarette smoker   Diabetes mellitus type 2, uncontrolled (HCC)   Hypomagnesemia  Expressive Aphasia  Acute Stroke MRI brain motion degrated, but notable for punctate acute infarction at the L frontal gray white junction - ? Few other punctate foci of restricted diffusion in the L frontal operculum, deep insula, and radiating white matter tracts on the L.  Low level enhancement in the deep insula and radiating white matter tracts on the left (probably indicating these are late acute or subacute infarctions).  Given acute infarctio in the L fruntal lobe, punctate infarctions of differing ages suggest recurrent embolic infarctions, possibly from the L carotid bifurcation in this case.  Echo with EF 62-70%, grade 1 diastolic dysfunction (see report) Carotid US pending Neurology c/s - appreciate recs - EEG, repeat MRI (as above), UA LDL 117, A1c 12 PT/OT/SLP Permissive hypertension  Vertigo  History of Traumatic Brain Injury Vestibular rehab  Hypertension  Hold antihypertensives for now Was on clonidine at home? Follow for rebound, holding for now.  T2DM Start insulin (on metformin alone at home) [facility] Diabetes coordinator   Tobacco Abuse Smoking cessation   ?COPD Needs spirometry outpatient  DVT prophylaxis: SCD Code Status: full  Family Communication: none at bedside Disposition:   Status is: Inpatient  Remains inpatient appropriate because:Inpatient level of care appropriate due to severity of illness   Dispo: The patient is from: Home              Anticipated d/c is to: Home              Patient  currently is not medically stable to d/c.   Difficult to place patient No       Consultants:   neurology  Procedures:  Echo IMPRESSIONS    1. Left ventricular ejection fraction, by estimation, is 60 to 65%. The  left ventricle has normal function. The left ventricle has no regional  wall motion abnormalities. Left ventricular diastolic parameters are  consistent with Grade I diastolic  dysfunction (impaired relaxation).  2. Right ventricular systolic function is normal. The right ventricular  size is normal.  3. The mitral valve is grossly normal. Trivial mitral valve  regurgitation.  4. The aortic valve is normal in structure. Aortic valve regurgitation is  not visualized. No aortic stenosis is present.   Antimicrobials: Anti-infectives (From admission, onward)   None      Subjective: No new complaints Speech difficulties  Objective: Vitals:   11/16/20 2028 11/16/20 2302 11/17/20 0852 11/17/20 1014  BP:  (!) 129/58 (!) 129/59 126/61  Pulse:  82 68   Resp:  18 19   Temp:  98.3 F (36.8 C) 97.6 F (36.4 C)   TempSrc:  Oral Oral   SpO2:  91% 96%   Weight: 113 kg     Height: '5\' 6"'  (1.676 m)       Intake/Output Summary (Last 24 hours) at 11/17/2020 1059 Last data filed at 11/17/2020 0600 Gross per 24 hour  Intake 16.41 ml  Output 800 ml  Net -783.59 ml   Filed Weights   11/16/20 2028  Weight: 113 kg    Examination:  General exam: Appears calm and comfortable  Respiratory system: Clear to auscultation. Respiratory effort normal. Cardiovascular system: S1 & S2 heard, RRR.  Gastrointestinal system: Abdomen is nondistended, soft and nontender.  Central nervous system: Alert and oriented. Mild word finding difficulties.  CN 2-12 intact.  Symmetric strength.  Extremities: no LEE Skin: No rashes, lesions or ulcers Psychiatry: Judgement and insight appear normal. Mood & affect appropriate.     Data Reviewed: I have personally reviewed following labs  and imaging studies  CBC: Recent Labs  Lab 11/16/20 2037  WBC 14.0*  HGB 14.3  HCT 41.8  MCV 91.5  PLT 161    Basic Metabolic Panel: Recent Labs  Lab 11/16/20 2037  NA 131*  K 4.0  CL 97*  CO2 29  GLUCOSE 234*  BUN 15  CREATININE 0.76  CALCIUM 8.5*  MG 1.9    GFR: Estimated Creatinine Clearance: 93 mL/min (by C-G formula based on SCr of 0.76 mg/dL).  Liver Function Tests: Recent Labs  Lab 11/16/20 2037  AST 17  ALT 14  ALKPHOS 81  BILITOT 0.3  PROT 5.9*  ALBUMIN 2.6*    CBG: Recent Labs  Lab 11/16/20 2114 11/17/20 0633  GLUCAP 257* 209*     No results found for this or  any previous visit (from the past 240 hour(s)).       Radiology Studies: MR BRAIN WO CONTRAST  Result Date: 11/17/2020 CLINICAL DATA:  Acute neurologic deficit EXAM: MRI HEAD WITHOUT CONTRAST TECHNIQUE: Multiplanar, multiecho pulse sequences of the brain and surrounding structures were obtained without intravenous contrast. COMPARISON:  None. FINDINGS: The complete examination protocol was not finished. There are 6 series provided for interpretation. There is no acute infarct. No midline shift or other mass effect. There is moderate white matter disease. IMPRESSION: Truncated examination. No acute infarct. Electronically Signed   By: Ulyses Jarred M.D.   On: 11/17/2020 00:53        Scheduled Meds: . aspirin EC  81 mg Oral Daily  . hydrochlorothiazide  12.5 mg Oral Daily  . [START ON 11/18/2020] influenza vac split quadrivalent PF  0.5 mL Intramuscular Tomorrow-1000  . insulin aspart  0-15 Units Subcutaneous TID WC  . insulin aspart  0-5 Units Subcutaneous QHS  . insulin glargine  15 Units Subcutaneous Daily  . insulin starter kit- pen needles  1 kit Other Once  . lisinopril  5 mg Oral Daily  . living well with diabetes book   Does not apply Once  . nicotine  14 mg Transdermal Daily  . [START ON 11/18/2020] pneumococcal 23 valent vaccine  0.5 mL Intramuscular Tomorrow-1000    Continuous Infusions: . sodium chloride 100 mL/hr at 11/16/20 2248     LOS: 1 day    Time spent: over 30 min    Fayrene Helper, MD [facility]   To contact the attending provider between 7A-7P or the covering provider during after hours 7P-7A, please log into the web site [email or web address] and access using universal [place] password for that web site. If you do not have the password, please call the hospital operator.  11/17/2020, 10:59 AM

## 2020-11-17 NOTE — Progress Notes (Signed)
EEG complete - results pending 

## 2020-11-17 NOTE — Progress Notes (Addendum)
Inpatient Diabetes Program Recommendations  AACE/ADA: New Consensus Statement on Inpatient Glycemic Control (2015)  Target Ranges:  Prepandial:   less than 140 mg/dL      Peak postprandial:   less than 180 mg/dL (1-2 hours)      Critically ill patients:  140 - 180 mg/dL   Lab Results  Component Value Date   GLUCAP 209 (H) 11/17/2020   HGBA1C 12.0 (H) 11/16/2020    Review of Glycemic Control  Diabetes history: DM 2 Outpatient Diabetes medications: Metformin (has not had in 1 year) Current orders for Inpatient glycemic control:  Novolog 0-15 units tid + hs  A1c 12%  Inpatient Diabetes Program Recommendations:    -  Start Lantus 15 units (0.1 units/kg)  Ordered Living well with Diabetes educational booklet in addition to insulin pen teaching kit.  Will teach pt how to do insulin in addition to re education on lifestyle modifications. Will see pt today.  Need follow up at clinic in order to get insulin pt has no insurance or PCP.  Addendum 2:31 pm: Spoke with pt at bedside regarding A1c and DM control at home. Pt has not been on medications for 2 years. Pt had workers comp case and it was not restarted at completion of workers comp. Pt reports having a glucose meter at home that she had had less than a year. Pt had some aphasia during conversation.  Pt has a fear of needles and at first did not want to do insulin due to family members who had "not done well and had died with DM and was taking insulin." Discussed current A1c and need for insulin as each oral med can only lower the A1c percentage 1-2 points at the most. Pt reports noncompliance with diet and was not exercising.  Pt reports sister has DM and takes insulin and is familiar with the insulin pen as well as the vial and syringe methods. Walked pt through how to operate the insulin pen. Called pts sister over the phone. Pt lives with sister and sister can help give pt insulin. Discussed change in lifestyle  behaviors.  Looking at current glucose trends and affordability pt would most likely benefit converting to 70/30 insulin at time of d/c as she would most likely need meal coverage in addition to basal insulin with current 15 units of basal insulin 70/30 dose is 12 units bid.     D/c:  Insulin pen needles order # [id number] Glucose meter kit order # [id number]  Thanks,  Tama Headings RN, MSN, BC-ADM Inpatient Diabetes Coordinator Team Pager [phone] (8a-5p)

## 2020-11-17 NOTE — Progress Notes (Signed)
Pt admitted from [facility] on 11/14/2020 at approximately 1900. Pt is A&Ox4 with c/o headache that is being treated with PRN analgesic. Pt has trouble finding the occasional word and speech is slightly slurred with some words. NIH: 1. Pt passed [facility] swallow screen and diet has been advanced to heart healthy/carb mod. Pt on 4LNC secondary to COPD history and active smoking. Pt educated on call bell usage and fall prevention strategies. Pt also educated on smoking cessation importance and new anti-hypertensive medications. Pt has agreed with plan of care. Will continue to monitor.   Problem: Health Behavior/Discharge Planning: Goal: Ability to manage health-related needs will improve Outcome: Progressing   Problem: Clinical Measurements: Goal: Ability to maintain clinical measurements within normal limits will improve Outcome: Progressing Goal: Will remain free from infection Outcome: Progressing Goal: Diagnostic test results will improve Outcome: Progressing Goal: Respiratory complications will improve Outcome: Progressing Goal: Cardiovascular complication will be avoided Outcome: Progressing   Problem: Activity: Goal: Risk for activity intolerance will decrease Outcome: Progressing   Problem: Nutrition: Goal: Adequate nutrition will be maintained Outcome: Progressing   Problem: Coping: Goal: Level of anxiety will decrease Outcome: Progressing   Problem: Skin Integrity: Goal: Risk for impaired skin integrity will decrease Outcome: Progressing   Problem: Education: Goal: Knowledge of disease or condition will improve Outcome: Progressing Goal: Knowledge of secondary prevention will improve Outcome: Progressing Goal: Knowledge of patient specific risk factors addressed and post discharge goals established will improve Outcome: Progressing   Problem: Nutrition: Goal: Risk of aspiration will decrease Outcome: Progressing   Problem: Coping: Goal: Will identify appropriate  support needs Outcome: Progressing   

## 2020-11-18 ENCOUNTER — Ambulatory Visit (HOSPITAL_COMMUNITY): Payer: Self-pay | Attending: Family Medicine

## 2020-11-18 ENCOUNTER — Inpatient Hospital Stay (HOSPITAL_COMMUNITY): Payer: Medicaid Other

## 2020-11-18 LAB — GLUCOSE, CAPILLARY
Glucose-Capillary: 216 mg/dL — ABNORMAL HIGH (ref 70–99)
Glucose-Capillary: 212 mg/dL — ABNORMAL HIGH (ref 70–99)
Glucose-Capillary: 283 mg/dL — ABNORMAL HIGH (ref 70–99)
Glucose-Capillary: 141 mg/dL — ABNORMAL HIGH (ref 70–99)
Glucose-Capillary: 202 mg/dL — ABNORMAL HIGH (ref 70–99)

## 2020-11-18 LAB — CBC WITH DIFFERENTIAL/PLATELET
Abs Immature Granulocytes: 0.05 10*3/uL (ref 0.00–0.07)
Basophils Absolute: 0.1 10*3/uL (ref 0.0–0.1)
Basophils Relative: 1 %
Eosinophils Absolute: 0.4 10*3/uL (ref 0.0–0.5)
Eosinophils Relative: 3 %
HCT: 41 % (ref 36.0–46.0)
Hemoglobin: 13.6 g/dL (ref 12.0–15.0)
Immature Granulocytes: 0 %
Lymphocytes Relative: 35 %
Lymphs Abs: 3.9 10*3/uL (ref 0.7–4.0)
MCH: 31 pg (ref 26.0–34.0)
MCHC: 33.2 g/dL (ref 30.0–36.0)
MCV: 93.4 fL (ref 80.0–100.0)
Monocytes Absolute: 0.8 10*3/uL (ref 0.1–1.0)
Monocytes Relative: 7 %
Neutro Abs: 6 10*3/uL (ref 1.7–7.7)
Neutrophils Relative %: 54 %
Platelets: 264 10*3/uL (ref 150–400)
RBC: 4.39 MIL/uL (ref 3.87–5.11)
RDW: 12.5 % (ref 11.5–15.5)
WBC: 11.2 10*3/uL — ABNORMAL HIGH (ref 4.0–10.5)
nRBC: 0 % (ref 0.0–0.2)

## 2020-11-18 LAB — COMPREHENSIVE METABOLIC PANEL
ALT: 10 U/L (ref 0–44)
AST: 14 U/L — ABNORMAL LOW (ref 15–41)
Albumin: 2.5 g/dL — ABNORMAL LOW (ref 3.5–5.0)
Alkaline Phosphatase: 70 U/L (ref 38–126)
Anion gap: 6 (ref 5–15)
BUN: 14 mg/dL (ref 8–23)
CO2: 29 mmol/L (ref 22–32)
Calcium: 8.7 mg/dL — ABNORMAL LOW (ref 8.9–10.3)
Chloride: 101 mmol/L (ref 98–111)
Creatinine, Ser: 0.69 mg/dL (ref 0.44–1.00)
GFR, Estimated: >60 mL/min (ref >60)
Glucose, Bld: 216 mg/dL — ABNORMAL HIGH (ref 70–99)
Potassium: 3.6 mmol/L (ref 3.5–5.1)
Sodium: 136 mmol/L (ref 135–145)
Total Bilirubin: 0.7 mg/dL (ref 0.3–1.2)
Total Protein: 5.8 g/dL — ABNORMAL LOW (ref 6.5–8.1)

## 2020-11-18 LAB — MAGNESIUM: Magnesium: 1.7 mg/dL (ref 1.7–2.4)

## 2020-11-18 LAB — PHOSPHORUS: Phosphorus: 3.6 mg/dL (ref 2.5–4.6)

## 2020-11-18 IMAGING — CT CT ANGIO HEAD
1 of 11 series · 5 of 33 positions shown · IV contrast (APPLIED)
Comparison: Prior MRI from [DATE]

CLINICAL DATA: Follow-up examination for acute stroke.

EXAM:
CT ANGIOGRAPHY HEAD AND NECK
TECHNIQUE: Multidetector CT imaging of the head and neck was performed using
the standard protocol during bolus administration of intravenous
contrast. Multiplanar CT image reconstructions and MIPs were
obtained to evaluate the vascular anatomy. Carotid stenosis
measurements (when applicable) are obtained utilizing NASCET
criteria, using the distal internal carotid diameter as the
denominator.
CONTRAST:  75mL OMNIPAQUE IOHEXOL 350 MG/ML SOLN

[Series 11: ax thins · axial · 0.44mm/px · z∈[+1242,+1494]mm · 5 of 379 slices shown]
[im 64/379  soft-tissue]
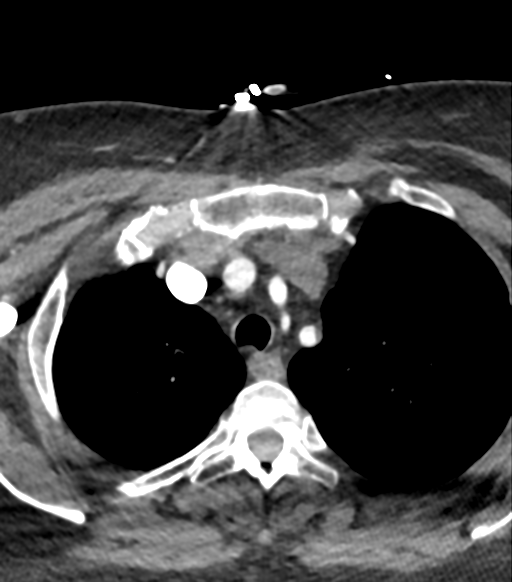
[im 127/379  bone]
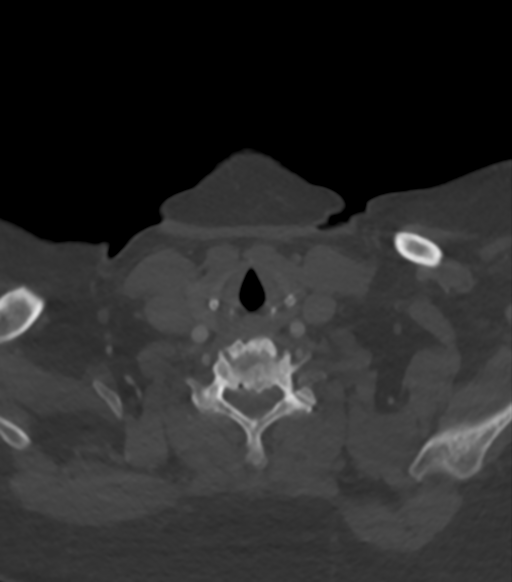
[im 190/379  soft-tissue]
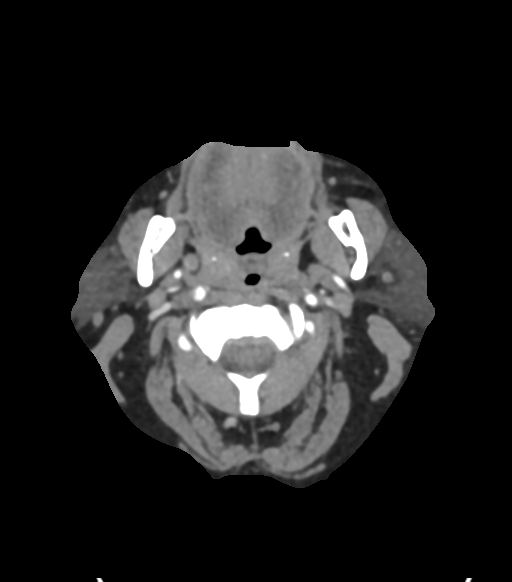
[im 253/379  bone]
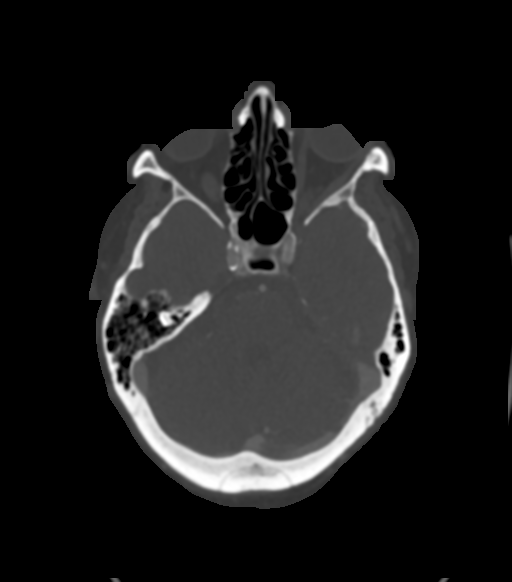
[im 316/379  soft-tissue]
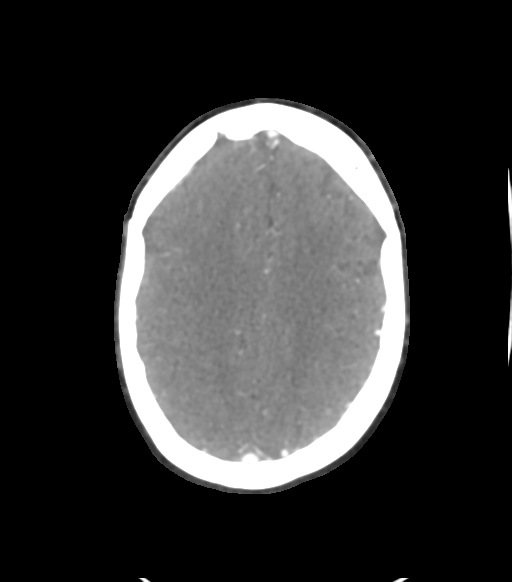

[5 of 33 positions shown; findings below may reference images not displayed]

FINDINGS: CT HEAD FINDINGS

Brain: Age-related cerebral atrophy with moderate chronic
microvascular ischemic disease. Previously identified scattered
punctate ischemic infarcts not visible by CT. No other acute large
vessel territory infarct. No intracranial hemorrhage. No mass lesion
or mass effect. No hydrocephalus or extra-axial fluid collection.

Vascular: No hyperdense vessel.

Skull: Scalp soft tissues and calvarium within normal limits.

Sinuses: Paranasal sinuses and mastoid air cells are clear.

Orbits: Globes and orbital soft tissues demonstrate no acute
finding.

Review of the MIP images confirms the above findings

CTA NECK FINDINGS

Aortic arch: Visualized aortic arch normal in caliber with normal
branch pattern. Mild-to-moderate atheromatous change about the
origin of the great vessels. Visualized subclavian arteries patent.

Right carotid system: Right CCA patent from its origin to the
bifurcation without stenosis. Eccentric calcified plaque at the
right carotid bulb/proximal right ICA without hemodynamically
significant stenosis. Right ICA patent distally without stenosis,
dissection or occlusion.

Left carotid system: Atheromatous plaque at the origin of the left
CCA with associated stenosis of up to 55% by NASCET criteria. There
is focal intimal irregularity at the ostium of the left CCA, which
could reflect changes of a recently ruptured plaque (series 11,
image 326). Finding could serve as a possible embolic source. Left
CCA patent distally to the bifurcation. Eccentric calcified plaque
at the left carotid bulb/proximal left ICA without significant
stenosis. Left ICA patent distally without significant stenosis,
dissection or occlusion.

Vertebral arteries: Left vertebral artery arises directly from the
aortic arch, right vertebral artery arises from the right subclavian
artery. Atheromatous plaque at the origin of the right vertebral
artery with approximate 70% stenosis. Vertebral arteries otherwise
patent within the neck without stenosis, dissection or occlusion.

Skeleton: No visible acute osseous finding. No discrete or worrisome
osseous lesions. Congenital fusion of the C2 and C3 vertebral bodies
noted.

Other neck: No other acute soft tissue abnormality within the neck.
No mass or adenopathy. Incidental note made of a 1 cm left thyroid
nodule, felt to be of doubtful significance given size and patient
age. No follow-up imaging recommended regarding this lesion (ref: [HOSPITAL]. [DATE]): 143-50).

Upper chest: Small focus of ground-glass opacity at the mid left
upper lobe noted, likely a small focus of atelectasis. Visualized
upper chest demonstrates no other acute finding.

Review of the MIP images confirms the above findings

CTA HEAD FINDINGS

Anterior circulation: Petrous segments patent bilaterally.
Atheromatous change throughout the carotid siphons with associated
mild diffuse narrowing. A1 segments patent bilaterally. Normal
anterior communicating artery complex. Anterior cerebral arteries
patent to their distal aspects without stenosis. No M1 stenosis or
occlusion. Normal MCA bifurcations. Distal MCA branches well
perfused and symmetric.

Posterior circulation: Both vertebral arteries patent to the
vertebrobasilar junction without stenosis. Both PICA origins patent
and normal. Basilar widely patent to its distal aspect. Superior
cerebral arteries patent bilaterally. Both PCAs well perfused to
their distal aspects without stenosis.

Venous sinuses: Patent.

Anatomic variants: None significant.  No aneurysm.

Review of the MIP images confirms the above findings
IMPRESSION: CT HEAD IMPRESSION:

1. No acute intracranial abnormality. Recently identified
subcentimeter ischemic infarcts not visible by CT.
2. Age-related cerebral atrophy with chronic small vessel ischemic
disease.

CTA HEAD AND NECK IMPRESSION:

1. Negative CTA for large vessel occlusion.
2. Atheromatous plaque at the origin of the left CCA with associated
stenosis of up to 55% by NASCET criteria. Focal intimal irregularity
at this level could reflect changes of a recently ruptured plaque,
which could serve as a possible embolic source.
3. 70% atheromatous stenosis at the origin of the right vertebral
artery. Posterior circulation otherwise widely patent.

## 2020-11-18 MED ORDER — ATORVASTATIN CALCIUM 80 MG PO TABS
80.0000 mg | ORAL_TABLET | Freq: Every day | ORAL | Status: DC
  Administered 2020-11-19: 80 mg via ORAL
  Filled 2020-11-18: qty 1

## 2020-11-18 MED ORDER — CLOPIDOGREL BISULFATE 75 MG PO TABS
75.0000 mg | ORAL_TABLET | Freq: Every day | ORAL | Status: DC
  Administered 2020-11-18 – 2020-11-19 (×2): 75 mg via ORAL
  Filled 2020-11-18 (×2): qty 1

## 2020-11-18 MED ORDER — INSULIN GLARGINE 100 UNIT/ML ~~LOC~~ SOLN
5.0000 [IU] | Freq: Once | SUBCUTANEOUS | Status: AC
  Administered 2020-11-18: 5 [IU] via SUBCUTANEOUS
  Filled 2020-11-18: qty 0.05

## 2020-11-18 MED ORDER — IOHEXOL 350 MG/ML SOLN
75.0000 mL | Freq: Once | INTRAVENOUS | Status: AC | PRN
  Administered 2020-11-18: 75 mL via INTRAVENOUS

## 2020-11-18 MED ORDER — INSULIN GLARGINE 100 UNIT/ML ~~LOC~~ SOLN
20.0000 [IU] | Freq: Every day | SUBCUTANEOUS | Status: DC
  Administered 2020-11-19: 20 [IU] via SUBCUTANEOUS
  Filled 2020-11-18: qty 0.2

## 2020-11-18 NOTE — Progress Notes (Signed)
Physical Therapy Treatment Patient Details Name: Olivia Bell MRN: [id number] DOB: 1955-11-27 Today's Date: 11/18/2020    History of Present Illness Pt admitted from [facility] on 3/11 for MRI of brain due to several day history of "not feeling right" followed by aphasia. 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 PMH: HTN, COPD, DM2, smoker, TBI.    PT Comments    Pt progressing towards physical therapy goals. Noted vestibular consult placed. 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 Because of this, no formal vestibular evaluation performed, and focused more on accommodation and gaze stabilization techniques when mobilizing. Noted stroke was confirmed on MRI, so frequency updated to 4x/week per departmental protocol. Will continue to follow and progress as able per POC.    Follow Up Recommendations  Home health PT;Supervision for mobility/OOB     Equipment Recommendations  Rolling walker with 5" wheels    Recommendations for Other Services       Precautions / Restrictions Precautions Precautions: Fall Precaution Comments: Baseline dizziness from TBI Restrictions Weight Bearing Restrictions: No    Mobility  Bed Mobility Overal bed mobility: Needs Assistance Bed Mobility: Sit to Supine       Sit to supine: Supervision   General bed mobility comments: OOB in recliner upon arrival, returned to bed at end of session. With [facility] flat pt was able to transition back to supine without assistance.    Transfers Overall transfer level: Needs assistance Equipment used: None Transfers: Sit to/from [employer] Sit to Stand: Min assist Stand pivot transfers: Min assist       General transfer comment: Standing with Min Guard A for safety and  then requiring Min A for correction of posterior lean. Swift return to sitting before able to achieve full stand each time.  Ambulation/Gait Ambulation/Gait assistance: Min Chemical engineer (Feet): 200 Feet Assistive device: IV Pole Gait Pattern/deviations: Step-through pattern;Decreased stride length;Trunk flexed;Drifts right/left Gait velocity: Decreased Gait velocity interpretation: <1.31 ft/sec, indicative of household ambulator General Gait Details: Drifting R and L in hallway with occasional lateral losses of balance, requiring min assist to recover. Pt fatigued quickly and took several standing rest breaks throughout gait training.   Stairs             Wheelchair Mobility    Modified Rankin (Stroke Patients Only) Modified Rankin (Stroke Patients Only) Pre-Morbid Rankin Score: Slight disability Modified Rankin: Moderately severe disability     Balance Overall balance assessment: Needs assistance;History of Falls Sitting-balance support: Feet supported;No upper extremity supported Sitting balance-Leahy Scale: Good     Standing balance support: Bilateral upper extremity supported;During functional activity;No upper extremity supported Standing balance-Leahy Scale: Poor Standing balance comment: maintaining standing at sink without UE support; however moments of posterior lean with need for physical A                            Cognition Arousal/Alertness: Awake/alert Behavior During Therapy: WFL for tasks assessed/performed Overall Cognitive Status: Impaired/Different from baseline Area of Impairment: Problem solving;Safety/judgement                         Safety/Judgement: Decreased awareness of deficits;Decreased awareness of safety   Problem Solving: Slow processing;Requires verbal cues  Exercises      General Comments        Pertinent Vitals/Pain Pain Assessment: Faces Faces Pain Scale: No hurt Pain  Intervention(s): Monitored during session    Home Living                      Prior Function            PT Goals (current goals can now be found in the care plan section) Acute Rehab PT Goals Patient Stated Goal: return home PT Goal Formulation: With patient Time For Goal Achievement: 12/01/20 Potential to Achieve Goals: Good Progress towards PT goals: Progressing toward goals    Frequency    Min 4X/week      PT Plan Frequency needs to be updated    Co-evaluation              AM-PAC PT "6 Clicks" Mobility   Outcome Measure  Help needed turning from your back to your side while in a flat bed without using bedrails?: None Help needed moving from lying on your back to sitting on the side of a flat bed without using bedrails?: None Help needed moving to and from a bed to a chair (including a wheelchair)?: A Little Help needed standing up from a chair using your arms (e.g., wheelchair or bedside chair)?: A Little Help needed to walk in hospital room?: A Little Help needed climbing 3-5 steps with a railing? : A Little 6 Click Score: 20    End of Session Equipment Utilized During Treatment: Gait belt Activity Tolerance: Patient tolerated treatment well (dizziness) Patient left: in bed;with call bell/phone within reach (with MD and OT coming) Nurse Communication: Mobility status PT Visit Diagnosis: Unsteadiness on feet (R26.81);Difficulty in walking, not elsewhere classified (R26.2);Dizziness and giddiness (R42)     Time: 2633-3545 PT Time Calculation (min) (ACUTE ONLY): 28 min  Charges:  $Gait Training: 23-37 mins                     [name], PT, DPT Acute Rehabilitation Services Pager: [phone] Office: [phone]    [name] 11/18/2020, 2:40 PM

## 2020-11-18 NOTE — Progress Notes (Signed)
Inpatient Diabetes Program Recommendations  AACE/ADA: New Consensus Statement on Inpatient Glycemic Control (2015)  Target Ranges:  Prepandial:   less than 140 mg/dL      Peak postprandial:   less than 180 mg/dL (1-2 hours)      Critically ill patients:  140 - 180 mg/dL   Lab Results  Component Value Date   GLUCAP 216 (H) 11/18/2020   HGBA1C 12.0 (H) 11/16/2020    Review of Glycemic Control Results for Bell, Olivia (MRN [id number]) as of 11/18/2020 09:33  Ref. Range 11/17/2020 06:33 11/17/2020 11:34 11/17/2020 16:40 11/17/2020 23:30 11/18/2020 06:21  Glucose-Capillary Latest Ref Range: 70 - 99 mg/dL 209 (H) 357 (H) 245 (H) 194 (H) 216 (H)   Diabetes history: DM 2 Outpatient Diabetes medications: Metformin (has not had in 1 year) Current orders for Inpatient glycemic control:  Lantus 15 units Daily Novolog 0-15 units tid + hs  A1c 12%  Inpatient Diabetes Program Recommendations:    -  Increase Lantus to 20 units  Need follow up at clinic in order to get insulin pt has no insurance or PCP.  Looking at current glucose trends and affordability pt would most likely benefit converting to 70/30 insulin at time of d/c as she would most likely need meal coverage in addition to basal insulin with current 20 units of basal insulin 70/30 dose is 15 units bid.    D/c:  Insulin pen needles order # [id number] Glucose meter kit order # [id number]  Thanks,  Shannon Willis RN, MSN, BC-ADM Inpatient Diabetes Coordinator Team Pager [phone] (8a-5p)   

## 2020-11-18 NOTE — Progress Notes (Signed)
PROGRESS NOTE    Olivia Bell  ZOX:[id number] DOB: [birth year] DOA: 11/16/2020 PCP: Patient, No Pcp Per   No chief complaint on file.  Brief Narrative:  Olivia Bell is a 65 y.o. female with medical history significant for diabetes mellitus type II, hypertension, tobacco abuse who presents from [facility] ER in transfer for MRI of the brain.  Patient reports that she has not been feeling "right" the last 2 to 3 days.  She states this started as feeling like she was off balance and generalized weakness.  On Saturday, November 15, 2020 she laid down to take a nap in the afternoon and felt normal but when she woke up a few hours later she was not able to speak.  She states she had difficulty finding and saying the right words and her speech sounded thickened.  The symptoms have improved over the last 24 hours but she still struggles to find the right words to say and is speaking slower than normal she states.  She was admitted with aphasia and concern for stroke.  Her MRI showed findings concerning for stroke.  Continued workup pending (CTA head/neck pending today).  Assessment & Plan:   Principal Problem:   Expressive aphasia Active Problems:   Essential hypertension   Cigarette smoker   Diabetes mellitus type 2, uncontrolled (HCC)   Hypomagnesemia  Expressive Aphasia  Acute Stroke MRI brain motion degrated, but notable for punctate acute infarction at the L frontal gray white junction - ? Few other punctate foci of restricted diffusion in the L frontal operculum, deep insula, and radiating white matter tracts on the L.  Low level enhancement in the deep insula and radiating white matter tracts on the left (probably indicating these are late acute or subacute infarctions).  Given acute infarctio in the L fruntal lobe, punctate infarctions of differing ages suggest recurrent embolic infarctions, possibly from the L carotid bifurcation in this case.  Echo with EF 60-65%, grade 1 diastolic  dysfunction (see report) CTA head/neck pending Neurology c/s - appreciate recs - EEG (nonspecific cortical dysfunction in the temporal region), repeat MRI (as above), UA (not c/w UTI) LDL 117, A1c 12 PT/OT/SLP Permissive hypertension  Vertigo  History of Traumatic Brain Injury Vestibular rehab  Hypertension Hold antihypertensives for now Was on clonidine at home? Follow for rebound, holding for now.  T2DM Start insulin (on metformin alone at home) [facility] Diabetes coordinator -> recommending considering 70/30 at home on discharge  Tobacco Abuse Smoking cessation   ?COPD Needs spirometry outpatient  DVT prophylaxis: SCD Code Status: full  Family Communication: none at bedside Disposition:   Status is: Inpatient  Remains inpatient appropriate because:Inpatient level of care appropriate due to severity of illness   Dispo: The patient is from: Home              Anticipated d/c is to: Home              Patient currently is not medically stable to d/c.   Difficult to place patient No       Consultants:   neurology  Procedures:  Echo IMPRESSIONS    1. Left ventricular ejection fraction, by estimation, is 60 to 65%. The  left ventricle has normal function. The left ventricle has no regional  wall motion abnormalities. Left ventricular diastolic parameters are  consistent with Grade I diastolic  dysfunction (impaired relaxation).  2. Right ventricular systolic function is normal. The right ventricular  size is normal.  3. The mitral valve  is grossly normal. Trivial mitral valve  regurgitation.  4. The aortic valve is normal in structure. Aortic valve regurgitation is  not visualized. No aortic stenosis is present.   Antimicrobials: Anti-infectives (From admission, onward)   None      Subjective: No new complaints  Objective: Vitals:   11/18/20 0316 11/18/20 0853 11/18/20 1143 11/18/20 1645  BP: (!) 110/51 (!) 145/98 (!) 150/69 (!) 153/71  Pulse: 75  73 70 73  Resp: 18 20 20 18   Temp: 98.1 F (36.7 C) 97.7 F (36.5 C) 97.8 F (36.6 C) 98.1 F (36.7 C)  TempSrc: Oral Oral Oral Oral  SpO2: 92% 95% 93% 96%  Weight:      Height:        Intake/Output Summary (Last 24 hours) at 11/18/2020 1732 Last data filed at 11/18/2020 1100 Gross per 24 hour  Intake 2643.65 ml  Output -  Net 2643.65 ml   Filed Weights   11/16/20 2028  Weight: 113 kg    Examination:  General: No acute distress. Cardiovascular: Heart sounds show a regular rate, and rhythm Lungs: Clear to auscultation bilaterally Abdomen: Soft, nontender, nondistended  Neurological: Alert and oriented 3. Moves all extremities 4. Mild speech issues.  Cranial nerves II through XII grossly intact. Skin: Warm and dry. No rashes or lesions. Extremities: No clubbing or cyanosis. No edema.    Data Reviewed: I have personally reviewed following labs and imaging studies  CBC: Recent Labs  Lab 11/16/20 2037 11/18/20 0230  WBC 14.0* 11.2*  NEUTROABS  --  6.0  HGB 14.3 13.6  HCT 41.8 41.0  MCV 91.5 93.4  PLT 289 264    Basic Metabolic Panel: Recent Labs  Lab 11/16/20 2037 11/18/20 0230  NA 131* 136  K 4.0 3.6  CL 97* 101  CO2 29 29  GLUCOSE 234* 216*  BUN 15 14  CREATININE 0.76 0.69  CALCIUM 8.5* 8.7*  MG 1.9 1.7  PHOS  --  3.6    GFR: Estimated Creatinine Clearance: 93 mL/min (by C-G formula based on SCr of 0.69 mg/dL).  Liver Function Tests: Recent Labs  Lab 11/16/20 2037 11/18/20 0230  AST 17 14*  ALT 14 10  ALKPHOS 81 70  BILITOT 0.3 0.7  PROT 5.9* 5.8*  ALBUMIN 2.6* 2.5*    CBG: Recent Labs  Lab 11/17/20 2330 11/18/20 0621 11/18/20 0951 11/18/20 1149 11/18/20 1648  GLUCAP 194* 216* 212* 283* 141*     No results found for this or any previous visit (from the past 240 hour(s)).       Radiology Studies: MR BRAIN WO CONTRAST  Result Date: 11/17/2020 CLINICAL DATA:  Acute neurologic deficit EXAM: MRI HEAD WITHOUT CONTRAST  TECHNIQUE: Multiplanar, multiecho pulse sequences of the brain and surrounding structures were obtained without intravenous contrast. COMPARISON:  None. FINDINGS: The complete examination protocol was not finished. There are 6 series provided for interpretation. There is no acute infarct. No midline shift or other mass effect. There is moderate white matter disease. IMPRESSION: Truncated examination. No acute infarct. Electronically Signed   By: 11/19/2020 M.D.   On: 11/17/2020 00:53   MR BRAIN W WO CONTRAST  Result Date: 11/17/2020 CLINICAL DATA:  Word finding difficulty. Negative limited study earlier today. EXAM: MRI HEAD WITHOUT AND WITH CONTRAST TECHNIQUE: Multiplanar, multiecho pulse sequences of the brain and surrounding structures were obtained without and with intravenous contrast. CONTRAST:  50mL GADAVIST GADOBUTROL 1 MMOL/ML IV SOLN COMPARISON:  Limited study earlier same day.  CT studies 11/15/2020. FINDINGS: Brain: This examination also suffers from motion degradation. There is a definite acute punctate infarction at the left frontal gray-white junction. Question 1 or 2 other punctate foci of restricted diffusion in the left frontal operculum and radiating white matter tracts on the left. Those are not certain. Elsewhere, chronic small-vessel ischemic changes affect the pons. No focal cerebellar abnormality is seen. Moderate to advanced chronic small-vessel ischemic changes affect the cerebral hemispheric white matter. After contrast administration, there is a small focus of enhancement in the deep insula on the left probably corresponding to a late acute/subacute infarction in that location. Similarly, low level enhancement is seen in the radiating white matter tracts on the left probably representing late acute/subacute infarction. No other abnormal enhancement is seen. No sign of mass, hemorrhage, hydrocephalus or extra-axial collection. Vascular: Major vessels at the base of the brain show  flow. Skull and upper cervical spine: Negative Sinuses/Orbits: Clear/normal Other: None IMPRESSION: 1. Severely motion degraded exam. 2. Punctate acute infarction at the left frontal gray-white junction. Question few other punctate foci of restricted diffusion in the left frontal operculum, deep insula and radiating white matter tracts on the left. 3. Low level enhancement in the deep insula and radiating white matter tracts on the left, probably indicating that these are late acute or subacute infarctions. Given the acute infarction in the left frontal lobe, punctate infarctions of differing ages suggest recurrent embolic infarctions, possibly from the left carotid bifurcation in this case. 4. Moderate to advanced chronic small-vessel ischemic changes elsewhere throughout the brain. Electronically Signed   By: Paulina FusiMark  Shogry M.D.   On: 11/17/2020 18:39   EEG adult  Result Date: 11/17/2020 Charlsie QuestYadav, Priyanka O, MD     11/18/2020  9:15 AM Patient Name: Allegra GranaCorliss Collignon MRN: [id number] Epilepsy Attending: Charlsie QuestPriyanka O Yadav Referring Physician/Provider: Dr. Lacretia Nicksaldwell Powell Date: 11/17/2020 Duration: 24.18 mins Patient history: 65 year old female with speech disturbance.  EEG to evaluate for seizures. Level of alertness: Awake, asleep AEDs during EEG study: None Technical aspects: This EEG study was done with scalp electrodes positioned according to the 10-20 International system of electrode placement. Electrical activity was acquired at a sampling rate of 500Hz  and reviewed with a high frequency filter of 70Hz  and a low frequency filter of 1Hz . EEG data were recorded continuously and digitally stored. Description: The posterior dominant rhythm consists of 8.5 Hz activity of moderate voltage (25-35 uV) seen predominantly in posterior head regions, symmetric and reactive to eye opening and eye closing. Sleep was characterized by vertex waves, sleep spindles (12 to 14 Hz), maximal frontocentral region. EEG showed intermittent  3-4 Hz theta-delta slowing in left temporal region. Physiologic photic driving was not seen during photic stimulation.  Hyperventilation was not performed.   ABNORMALITY -Intermittent slow, left temporal region IMPRESSION: This study is suggestive of nonspecific cortical dysfunction in the temporal region. No seizures or epileptiform discharges were seen throughout the recording. Charlsie Questriyanka O Yadav   ECHOCARDIOGRAM COMPLETE  Result Date: 11/17/2020    ECHOCARDIOGRAM REPORT   Patient Name:   Allegra GranaCORLISS Jacquet Date of Exam: 11/17/2020 Medical Rec #:  [id number]      Height:       66.0 in Accession #:    [id number]     Weight:       249.1 lb Date of Birth:  1956/07/09      BSA:          2.195 m Patient Age:    [age] years  BP:           129/58 mmHg Patient Gender: F              HR:           72 bpm. Exam Location:  Inpatient Procedure: 2D Echo, 3D Echo, Cardiac Doppler, Color Doppler and Strain Analysis Indications:    CVA  History:        Patient has no prior history of Echocardiogram examinations.                 COPD, Signs/Symptoms:Chest Pain; Risk Factors:Diabetes and                 Hypertension.  Sonographer:    Neomia Dear RDCS Referring Phys: [id number] BRADLEY S CHOTINER IMPRESSIONS  1. Left ventricular ejection fraction, by estimation, is 60 to 65%. The left ventricle has normal function. The left ventricle has no regional wall motion abnormalities. Left ventricular diastolic parameters are consistent with Grade I diastolic dysfunction (impaired relaxation).  2. Right ventricular systolic function is normal. The right ventricular size is normal.  3. The mitral valve is grossly normal. Trivial mitral valve regurgitation.  4. The aortic valve is normal in structure. Aortic valve regurgitation is not visualized. No aortic stenosis is present. FINDINGS  Left Ventricle: Left ventricular ejection fraction, by estimation, is 60 to 65%. The left ventricle has normal function. The left ventricle has no regional wall  motion abnormalities. The left ventricular internal cavity size was normal in size. There is  no left ventricular hypertrophy. Left ventricular diastolic parameters are consistent with Grade I diastolic dysfunction (impaired relaxation). Right Ventricle: The right ventricular size is normal. No increase in right ventricular wall thickness. Right ventricular systolic function is normal. Left Atrium: Left atrial size was normal in size. Right Atrium: Right atrial size was normal in size. Pericardium: There is no evidence of pericardial effusion. Mitral Valve: The mitral valve is grossly normal. Trivial mitral valve regurgitation. MV peak gradient, 4.2 mmHg. The mean mitral valve gradient is 2.0 mmHg. Tricuspid Valve: The tricuspid valve is grossly normal. Tricuspid valve regurgitation is not demonstrated. Aortic Valve: The aortic valve is normal in structure. Aortic valve regurgitation is not visualized. No aortic stenosis is present. Aortic valve mean gradient measures 4.0 mmHg. Aortic valve peak gradient measures 7.8 mmHg. Aortic valve area, by VTI measures 4.20 cm. Pulmonic Valve: The pulmonic valve was not well visualized. Pulmonic valve regurgitation is not visualized. Aorta: The aortic root and ascending aorta are structurally normal, with no evidence of dilitation. IAS/Shunts: The atrial septum is grossly normal.  LEFT VENTRICLE PLAX 2D LVIDd:         5.10 cm  Diastology LVIDs:         3.60 cm  LV e' medial:    7.51 cm/s LV PW:         1.10 cm  LV E/e' medial:  8.9 LV IVS:        1.00 cm  LV e' lateral:   33.40 cm/s LVOT diam:     2.40 cm  LV E/e' lateral: 2.0 LV SV:         123 LV SV Index:   56       2D Longitudinal Strain LVOT Area:     4.52 cm 2D Strain GLS Avg:     -16.1 %  RIGHT VENTRICLE RV S prime:     12.20 cm/s TAPSE (M-mode): 3.0 cm LEFT ATRIUM  Index       RIGHT ATRIUM           Index LA diam:        4.00 cm 1.82 cm/m  RA Area:     13.70 cm LA Vol (A2C):   43.4 ml 19.77 ml/m RA  Volume:   30.90 ml  14.07 ml/m LA Vol (A4C):   32.1 ml 14.62 ml/m LA Biplane Vol: 39.5 ml 17.99 ml/m  AORTIC VALVE                   PULMONIC VALVE AV Area (Vmax):    3.85 cm    PV Vmax:       0.96 m/s AV Area (Vmean):   4.00 cm    PV Vmean:      72.700 cm/s AV Area (VTI):     4.20 cm    PV VTI:        0.223 m AV Vmax:           140.00 cm/s PV Peak grad:  3.7 mmHg AV Vmean:          93.000 cm/s PV Mean grad:  2.0 mmHg AV VTI:            0.292 m AV Peak Grad:      7.8 mmHg AV Mean Grad:      4.0 mmHg LVOT Vmax:         119.00 cm/s LVOT Vmean:        82.200 cm/s LVOT VTI:          0.271 m LVOT/AV VTI ratio: 0.93  AORTA Ao Root diam: 3.20 cm Ao Asc diam:  3.10 cm MITRAL VALVE MV Area (PHT): 4.01 cm    SHUNTS MV Area VTI:   3.56 cm    Systemic VTI:  0.27 m MV Peak grad:  4.2 mmHg    Systemic Diam: 2.40 cm MV Mean grad:  2.0 mmHg MV Vmax:       1.03 m/s MV Vmean:      63.3 cm/s MV Decel Time: 189 msec MV E velocity: 66.80 cm/s MV A velocity: 93.80 cm/s MV E/A ratio:  0.71 Kristeen Miss MD Electronically signed by Kristeen Miss MD Signature Date/Time: 11/17/2020/11:56:07 AM    Final         Scheduled Meds: . aspirin EC  81 mg Oral Daily  . atorvastatin  40 mg Oral Daily  . insulin aspart  0-15 Units Subcutaneous TID WC  . insulin aspart  0-5 Units Subcutaneous QHS  . insulin glargine  15 Units Subcutaneous Daily  . nicotine  14 mg Transdermal Daily   Continuous Infusions: . sodium chloride 100 mL/hr at 11/18/20 1100     LOS: 2 days    Time spent: over 30 min    Lacretia Nicks, MD [facility]   To contact the attending provider between 7A-7P or the covering provider during after hours 7P-7A, please log into the web site [email or web address] and access using universal [place] password for that web site. If you do not have the password, please call the hospital operator.  11/18/2020, 5:32 PM

## 2020-11-18 NOTE — TOC Initial Note (Signed)
Transition of Care [facility]) - Initial/Assessment Note    Patient Details  Name: Olivia Bell MRN: [id number] Date of Birth: 10/20/1955  [facility] CM/SW Contact:    Kermit Balo, RN Phone Number: 11/18/2020, 8:21 AM  Clinical Narrative:                 Patient lives with her sister who is also having some health issues. Pt seems more concerned about making sure her sister has the care she needs.  Pt is without a PCP. Pt not very receptive to the clinics in [place] that are low cost to see MD. CM inquired about a [facility] and she wasn't sure about them either. CM provided information on AVS for her to arrange a PCP appt.  Pt has been doing the driving at home. Pt states she will have transport home at time of d/c with her daughter.  Pt will need medication assist at d/c. CM following for further d/c needs.  Expected Discharge Plan: Home w Home Health Services Barriers to Discharge: Inadequate or no insurance,Continued Medical Work up   Patient Goals and CMS Choice     Choice offered to / list presented to : Patient  Expected Discharge Plan and Services Expected Discharge Plan: Home w Home Health Services   Discharge Planning Services: CM Consult   Living arrangements for the past 2 months: Single Family Home                                      Prior Living Arrangements/Services Living arrangements for the past 2 months: Single Family Home Lives with:: Siblings Patient language and need for interpreter reviewed:: Yes Do you feel safe going back to the place where you live?: Yes      Need for Family Participation in Patient Care: Yes (Comment) Care giver support system in place?: No (comment)   Criminal Activity/Legal Involvement Pertinent to Current Situation/Hospitalization: No - Comment as needed  Activities of Daily Living Home Assistive Devices/Equipment: Cane (specify quad or straight) ADL Screening (condition at time of  admission) Patient's cognitive ability adequate to safely complete daily activities?: Yes Is the patient deaf or have difficulty hearing?: No Does the patient have difficulty seeing, even when wearing glasses/contacts?: No Does the patient have difficulty concentrating, remembering, or making decisions?: No Patient able to express need for assistance with ADLs?: Yes Does the patient have difficulty dressing or bathing?: No Independently performs ADLs?: Yes (appropriate for developmental age) Does the patient have difficulty walking or climbing stairs?: Yes Weakness of Legs: Both Weakness of Arms/Hands: None  Permission Sought/Granted                  Emotional Assessment Appearance:: Appears stated age Attitude/Demeanor/Rapport: Engaged Affect (typically observed): Accepting Orientation: : Oriented to Self,Oriented to Place,Oriented to  Time,Oriented to Situation   Psych Involvement: No (comment)  Admission diagnosis:  Expressive aphasia [R47.01] Patient Active Problem List   Diagnosis Date Noted  . Expressive aphasia 11/16/2020  . Diabetes mellitus type 2, uncontrolled (HCC) 11/16/2020  . Hypomagnesemia 11/16/2020  . Upper airway cough syndrome 02/21/2019  . Essential hypertension 02/06/2019  . Asthmatic bronchitis , chronic (HCC) 02/06/2019  . Chest pain, musculoskeletal 02/06/2019  . Cigarette smoker 02/06/2019   PCP:  Patient, No Pcp Per Pharmacy:   [facility] [address], [place] - [address]  [place] [address] Phone: [phone] Fax: [phone]     Social Determinants of Health (SDOH) Interventions    Readmission Risk Interventions No flowsheet data found.

## 2020-11-18 NOTE — Evaluation (Signed)
Speech Language Pathology Evaluation Patient Details Name: Olivia Bell MRN: [id number] DOB: [birth year] Today's Date: 11/18/2020 Time: 8250-5397 SLP Time Calculation (min) (ACUTE ONLY): 21 min  Problem List:  Patient Active Problem List   Diagnosis Date Noted  . Expressive aphasia 11/16/2020  . Diabetes mellitus type 2, uncontrolled (HCC) 11/16/2020  . Hypomagnesemia 11/16/2020  . Upper airway cough syndrome 02/21/2019  . Essential hypertension 02/06/2019  . Asthmatic bronchitis , chronic (HCC) 02/06/2019  . Chest pain, musculoskeletal 02/06/2019  . Cigarette smoker 02/06/2019   Past Medical History: History reviewed. No pertinent past medical history. Past Surgical History: History reviewed. No pertinent surgical history. HPI:  Pt presented from [facility] for MRI of brain due to several day history of "not feeling right" followed by aphasia. MRI 3/14: Acute infarction in the left frontal lobe, punctate infarctions of differing ages suggest recurrent embolic infarctions. PMH: HTN, COPD, DM2, smoker, TBI in 2021.   Assessment / Plan / Recommendation Clinical Impression  Pt presents with an expressive aphasia in which she scored a 90 out of 100 on the Bedside Western Aphasia Battery test. Her strengths include following commands, auditory verbal comprehension, and naming. However, she reports difficulty with word finding as well as verbally expressing what she is trying to say at the conversation level. She did demonstrate more difficulty with repetition at the phrase level and did not provide complete responses when asked why she was in the hospital or when describing a picture scene. Her spontaneous speech was characterized by some hesitations and word-finding difficulty throughout today's assessment. Although this assessment revealed many strengths in terms of her language skills, she would still benefit from follow up from SLP upon discharge given her subjective symptoms if needed.  Will sign off acutely.    SLP Assessment  SLP Recommendation/Assessment: All further Speech Lanaguage Pathology  needs can be addressed in the next venue of care SLP Visit Diagnosis: Cognitive communication deficit (R41.841)    Follow Up Recommendations  Home health SLP    Frequency and Duration           SLP Evaluation Cognition  Overall Cognitive Status: Impaired/Different from baseline Arousal/Alertness: Awake/alert Orientation Level: Oriented to situation;Oriented to person Attention: Sustained Sustained Attention: Appears intact Awareness: Appears intact       Comprehension  Auditory Comprehension Overall Auditory Comprehension: Appears within functional limits for tasks assessed Yes/No Questions: Within Functional Limits Commands: Within Functional Limits Conversation: Complex Reading Comprehension Reading Status: Not tested    Expression Expression Primary Mode of Expression: Verbal Verbal Expression Overall Verbal Expression: Impaired Initiation: No impairment Level of Generative/Spontaneous Verbalization: Conversation;Sentence Repetition: Impaired Level of Impairment: Sentence level Naming: Impairment Confrontation: Within functional limits Verbal Errors: Other (comment) (Word-finding errors in conversation) Non-Verbal Means of Communication: Not applicable Written Expression Written Expression: Not tested   Oral / Motor  Motor Speech Overall Motor Speech: Appears within functional limits for tasks assessed   GO                    Zettie Cooley., SLP Student 11/18/2020, 4:14 PM

## 2020-11-18 NOTE — Plan of Care (Signed)
  Problem: Education: Goal: Knowledge of General Education information will improve Description: Including pain rating scale, medication(s)/side effects and non-pharmacologic comfort measures Outcome: Progressing   Problem: Health Behavior/Discharge Planning: Goal: Ability to manage health-related needs will improve Outcome: Progressing   Problem: Clinical Measurements: Goal: Ability to maintain clinical measurements within normal limits will improve Outcome: Progressing Goal: Will remain free from infection Outcome: Progressing Goal: Diagnostic test results will improve Outcome: Progressing Goal: Respiratory complications will improve Outcome: Progressing Goal: Cardiovascular complication will be avoided Outcome: Progressing   Problem: Activity: Goal: Risk for activity intolerance will decrease Outcome: Progressing   Problem: Nutrition: Goal: Adequate nutrition will be maintained Outcome: Progressing   Problem: Coping: Goal: Level of anxiety will decrease Outcome: Progressing   Problem: Elimination: Goal: Will not experience complications related to bowel motility Outcome: Progressing Goal: Will not experience complications related to urinary retention Outcome: Progressing   Problem: Pain Managment: Goal: General experience of comfort will improve Outcome: Progressing   Problem: Safety: Goal: Ability to remain free from injury will improve Outcome: Progressing   Problem: Skin Integrity: Goal: Risk for impaired skin integrity will decrease Outcome: Progressing   Problem: Education: Goal: Knowledge of disease or condition will improve Outcome: Progressing Goal: Knowledge of secondary prevention will improve Outcome: Progressing Goal: Knowledge of patient specific risk factors addressed and post discharge goals established will improve Outcome: Progressing Goal: Individualized Educational Video(s) Outcome: Progressing   Problem: Coping: Goal: Will verbalize  positive feelings about self Outcome: Progressing Goal: Will identify appropriate support needs Outcome: Progressing   Problem: Health Behavior/Discharge Planning: Goal: Ability to manage health-related needs will improve Outcome: Progressing   Problem: Self-Care: Goal: Ability to participate in self-care as condition permits will improve Outcome: Progressing   
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
## 2020-11-18 NOTE — Progress Notes (Signed)
IVT consulted.  Upon arrival, pt not in room.  Charge advised to have RN put consult in when pt is in bed and ready for PIV assessment.

## 2020-11-19 ENCOUNTER — Inpatient Hospital Stay (HOSPITAL_COMMUNITY): Payer: Medicaid Other

## 2020-11-19 ENCOUNTER — Other Ambulatory Visit: Payer: Self-pay | Admitting: Internal Medicine

## 2020-11-19 DIAGNOSIS — I6330 Cerebral infarction due to thrombosis of unspecified cerebral artery: Secondary | ICD-10-CM | POA: Insufficient documentation

## 2020-11-19 DIAGNOSIS — F17210 Nicotine dependence, cigarettes, uncomplicated: Secondary | ICD-10-CM

## 2020-11-19 DIAGNOSIS — I6340 Cerebral infarction due to embolism of unspecified cerebral artery: Secondary | ICD-10-CM | POA: Insufficient documentation

## 2020-11-19 DIAGNOSIS — I639 Cerebral infarction, unspecified: Secondary | ICD-10-CM

## 2020-11-19 LAB — MAGNESIUM: Magnesium: 1.4 mg/dL — ABNORMAL LOW (ref 1.7–2.4)

## 2020-11-19 LAB — CBC WITH DIFFERENTIAL/PLATELET
Abs Immature Granulocytes: 0.06 10*3/uL (ref 0.00–0.07)
Basophils Absolute: 0.1 10*3/uL (ref 0.0–0.1)
Basophils Relative: 1 %
Eosinophils Absolute: 0.3 10*3/uL (ref 0.0–0.5)
Eosinophils Relative: 2 %
HCT: 38.7 % (ref 36.0–46.0)
Hemoglobin: 13.6 g/dL (ref 12.0–15.0)
Immature Granulocytes: 1 %
Lymphocytes Relative: 19 %
Lymphs Abs: 2.4 10*3/uL (ref 0.7–4.0)
MCH: 32 pg (ref 26.0–34.0)
MCHC: 35.1 g/dL (ref 30.0–36.0)
MCV: 91.1 fL (ref 80.0–100.0)
Monocytes Absolute: 0.9 10*3/uL (ref 0.1–1.0)
Monocytes Relative: 7 %
Neutro Abs: 8.9 10*3/uL — ABNORMAL HIGH (ref 1.7–7.7)
Neutrophils Relative %: 70 %
Platelets: 261 10*3/uL (ref 150–400)
RBC: 4.25 MIL/uL (ref 3.87–5.11)
RDW: 12.3 % (ref 11.5–15.5)
WBC: 12.5 10*3/uL — ABNORMAL HIGH (ref 4.0–10.5)
nRBC: 0 % (ref 0.0–0.2)

## 2020-11-19 LAB — GLUCOSE, CAPILLARY
Glucose-Capillary: 166 mg/dL — ABNORMAL HIGH (ref 70–99)
Glucose-Capillary: 233 mg/dL — ABNORMAL HIGH (ref 70–99)

## 2020-11-19 LAB — COMPREHENSIVE METABOLIC PANEL
ALT: 12 U/L (ref 0–44)
AST: 14 U/L — ABNORMAL LOW (ref 15–41)
Albumin: 2.6 g/dL — ABNORMAL LOW (ref 3.5–5.0)
Alkaline Phosphatase: 75 U/L (ref 38–126)
Anion gap: 9 (ref 5–15)
BUN: 11 mg/dL (ref 8–23)
CO2: 27 mmol/L (ref 22–32)
Calcium: 8.7 mg/dL — ABNORMAL LOW (ref 8.9–10.3)
Chloride: 99 mmol/L (ref 98–111)
Creatinine, Ser: 0.63 mg/dL (ref 0.44–1.00)
GFR, Estimated: >60 mL/min (ref >60)
Glucose, Bld: 149 mg/dL — ABNORMAL HIGH (ref 70–99)
Potassium: 3.7 mmol/L (ref 3.5–5.1)
Sodium: 135 mmol/L (ref 135–145)
Total Bilirubin: 0.9 mg/dL (ref 0.3–1.2)
Total Protein: 5.9 g/dL — ABNORMAL LOW (ref 6.5–8.1)

## 2020-11-19 LAB — PHOSPHORUS: Phosphorus: 3.2 mg/dL (ref 2.5–4.6)

## 2020-11-19 MED ORDER — ATORVASTATIN CALCIUM 80 MG PO TABS: 80.0000 mg | ORAL_TABLET | Freq: Every day | ORAL | 0 refills | Status: DC

## 2020-11-19 MED ORDER — ALBUTEROL SULFATE (2.5 MG/3ML) 0.083% IN NEBU: 2.5000 mg | INHALATION_SOLUTION | RESPIRATORY_TRACT | 1 refills | Status: AC | PRN

## 2020-11-19 MED ORDER — ASPIRIN 81 MG PO TBEC: 81.0000 mg | DELAYED_RELEASE_TABLET | Freq: Every day | ORAL | 0 refills | Status: DC

## 2020-11-19 MED ORDER — AMLODIPINE BESYLATE 5 MG PO TABS: 5.0000 mg | ORAL_TABLET | Freq: Every day | ORAL | 0 refills | Status: DC

## 2020-11-19 MED ORDER — INSULIN ASPART PROT & ASPART (70-30 MIX) 100 UNIT/ML PEN: 12.0000 [IU] | PEN_INJECTOR | Freq: Two times a day (BID) | SUBCUTANEOUS | 11 refills | Status: DC

## 2020-11-19 MED ORDER — BLOOD GLUCOSE METER KIT: PACK | 0 refills | Status: DC

## 2020-11-19 MED ORDER — COVID-19 MRNA VACC (MODERNA) 100 MCG/0.5ML IM SUSP
0.5000 mL | Freq: Once | INTRAMUSCULAR | Status: AC
  Administered 2020-11-19: 0.5 mL via INTRAMUSCULAR
  Filled 2020-11-19: qty 0.5

## 2020-11-19 MED ORDER — NICOTINE 14 MG/24HR TD PT24: 14.0000 mg | MEDICATED_PATCH | Freq: Every day | TRANSDERMAL | 0 refills | Status: DC

## 2020-11-19 MED ORDER — CLOPIDOGREL BISULFATE 75 MG PO TABS: 75.0000 mg | ORAL_TABLET | Freq: Every day | ORAL | 0 refills | Status: DC

## 2020-11-19 MED ORDER — METFORMIN HCL 1000 MG PO TABS: 1000.0000 mg | ORAL_TABLET | Freq: Two times a day (BID) | ORAL | 0 refills | Status: DC

## 2020-11-19 MED ORDER — STROKE: EARLY STAGES OF RECOVERY BOOK
Status: AC
  Filled 2020-11-19: qty 1

## 2020-11-19 NOTE — Progress Notes (Signed)
STROKE TEAM PROGRESS NOTE   INTERVAL HISTORY No acute events  Patient reports she is feeling much better today. She describes much improved speech.  She is hemodynamically and neurologically stable with mild word finding difficulty on exam.  We discussed stroke diagnosis, ongoing work up (Carotid artery doppler pending)  and plan of care. She states understanding that she needs to take better care of her health and is willing to follow up with PCP and neurology to gain better control of her stroke risk factors. She was advised to stop smoking.   Vitals:   11/18/20 2318 11/19/20 0341 11/19/20 0827 11/19/20 1143  BP: (!) 162/72 (!) 150/72 (!) 142/66 (!) 146/73  Pulse: 84 90 85 75  Resp: 17 17 18 16   Temp: 98.3 F (36.8 C) 100.1 F (37.8 C) 99.1 F (37.3 C) 98.6 F (37 C)  TempSrc: Oral Oral Oral Oral  SpO2: 95% 93% 95% 97%  Weight:      Height:       CBC:  Recent Labs  Lab 11/18/20 0230 11/19/20 0401  WBC 11.2* 12.5*  NEUTROABS 6.0 8.9*  HGB 13.6 13.6  HCT 41.0 38.7  MCV 93.4 91.1  PLT 264 261   Basic Metabolic Panel:  Recent Labs  Lab 11/18/20 0230 11/19/20 0401  NA 136 135  K 3.6 3.7  CL 101 99  CO2 29 27  GLUCOSE 216* 149*  BUN 14 11  CREATININE 0.69 0.63  CALCIUM 8.7* 8.7*  MG 1.7 1.4*  PHOS 3.6 3.2    Lipid Panel:  Recent Labs  Lab 11/17/20 0202  CHOL 190  TRIG 244*  HDL 24*  CHOLHDL 7.9  VLDL 49*  LDLCALC 117*    HgbA1c:  Recent Labs  Lab 11/16/20 2037  HGBA1C 12.0*   Urine Drug Screen: No results for input(s): LABOPIA, COCAINSCRNUR, LABBENZ, AMPHETMU, THCU, LABBARB in the last 168 hours.  Alcohol Level No results for input(s): ETH in the last 168 hours.  IMAGING past 24 hours CT ANGIO HEAD W OR WO CONTRAST  Result Date: 11/18/2020 CLINICAL DATA:  Follow-up examination for acute stroke. EXAM: CT ANGIOGRAPHY HEAD AND NECK TECHNIQUE: Multidetector CT imaging of the head and neck was performed using the standard protocol during bolus  administration of intravenous contrast. Multiplanar CT image reconstructions and MIPs were obtained to evaluate the vascular anatomy. Carotid stenosis measurements (when applicable) are obtained utilizing NASCET criteria, using the distal internal carotid diameter as the denominator. CONTRAST:  22mL OMNIPAQUE IOHEXOL 350 MG/ML SOLN COMPARISON:  Prior MRI from 11/17/2020 FINDINGS: CT HEAD FINDINGS Brain: Age-related cerebral atrophy with moderate chronic microvascular ischemic disease. Previously identified scattered punctate ischemic infarcts not visible by CT. No other acute large vessel territory infarct. No intracranial hemorrhage. No mass lesion or mass effect. No hydrocephalus or extra-axial fluid collection. Vascular: No hyperdense vessel. Skull: Scalp soft tissues and calvarium within normal limits. Sinuses: Paranasal sinuses and mastoid air cells are clear. Orbits: Globes and orbital soft tissues demonstrate no acute finding. Review of the MIP images confirms the above findings CTA NECK FINDINGS Aortic arch: Visualized aortic arch normal in caliber with normal branch pattern. Mild-to-moderate atheromatous change about the origin of the great vessels. Visualized subclavian arteries patent. Right carotid system: Right CCA patent from its origin to the bifurcation without stenosis. Eccentric calcified plaque at the right carotid bulb/proximal right ICA without hemodynamically significant stenosis. Right ICA patent distally without stenosis, dissection or occlusion. Left carotid system: Atheromatous plaque at the origin of the left  CCA with associated stenosis of up to 55% by NASCET criteria. There is focal intimal irregularity at the ostium of the left CCA, which could reflect changes of a recently ruptured plaque (series 11, image 326). Finding could serve as a possible embolic source. Left CCA patent distally to the bifurcation. Eccentric calcified plaque at the left carotid bulb/proximal left ICA without  significant stenosis. Left ICA patent distally without significant stenosis, dissection or occlusion. Vertebral arteries: Left vertebral artery arises directly from the aortic arch, right vertebral artery arises from the right subclavian artery. Atheromatous plaque at the origin of the right vertebral artery with approximate 70% stenosis. Vertebral arteries otherwise patent within the neck without stenosis, dissection or occlusion. Skeleton: No visible acute osseous finding. No discrete or worrisome osseous lesions. 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 Other neck: No other acute soft tissue abnormality within the neck. No mass or adenopathy. Incidental note made of a 1 cm left thyroid nodule, felt to be of doubtful significance given size and patient age. No follow-up imaging recommended regarding this lesion (ref: J Am Coll Radiol. 2015 Feb;12(2): 143-50). Upper chest: Small focus of ground-glass opacity at the mid left upper lobe noted, likely a small focus of atelectasis. Visualized upper chest demonstrates no other acute finding. Review of the MIP images confirms the above findings CTA HEAD FINDINGS Anterior circulation: Petrous segments patent bilaterally. Atheromatous change throughout the carotid siphons with associated mild diffuse narrowing. A1 segments patent bilaterally. Normal anterior communicating artery complex. Anterior cerebral arteries patent to their distal aspects without stenosis. No M1 stenosis or occlusion. Normal MCA bifurcations. Distal MCA branches well perfused and symmetric. Posterior circulation: Both vertebral arteries patent to the vertebrobasilar junction without stenosis. Both PICA origins patent and normal. Basilar widely patent to its distal aspect. Superior cerebral arteries patent bilaterally. Both PCAs well perfused to their distal aspects without stenosis. Venous sinuses: Patent. Anatomic variants: None significant.  No aneurysm. Review of the MIP images  confirms the above findings IMPRESSION: CT HEAD IMPRESSION: 1. No acute intracranial abnormality. Recently identified subcentimeter ischemic infarcts not visible by CT. 2. Age-related cerebral atrophy with chronic small vessel ischemic disease. CTA HEAD AND NECK IMPRESSION: 1. Negative CTA for large vessel occlusion. 2. Atheromatous plaque at the origin of the left CCA with associated stenosis of up to 55% by NASCET criteria. Focal intimal irregularity at this level could reflect changes of a recently ruptured plaque, which could serve as a possible embolic source. 3. 70% atheromatous stenosis at the origin of the right vertebral artery. Posterior circulation otherwise widely patent. Electronically Signed   By: Rise Mu M.D.   On: 11/18/2020 23:41   CT ANGIO NECK W OR WO CONTRAST  Result Date: 11/18/2020 CLINICAL DATA:  Follow-up examination for acute stroke. EXAM: CT ANGIOGRAPHY HEAD AND NECK TECHNIQUE: Multidetector CT imaging of the head and neck was performed using the standard protocol during bolus administration of intravenous contrast. Multiplanar CT image reconstructions and MIPs were obtained to evaluate the vascular anatomy. Carotid stenosis measurements (when applicable) are obtained utilizing NASCET criteria, using the distal internal carotid diameter as the denominator. CONTRAST:  75mL OMNIPAQUE IOHEXOL 350 MG/ML SOLN COMPARISON:  Prior MRI from 11/17/2020 FINDINGS: CT HEAD FINDINGS Brain: Age-related cerebral atrophy with moderate chronic microvascular ischemic disease. Previously identified scattered punctate ischemic infarcts not visible by CT. No other acute large vessel territory infarct. No intracranial hemorrhage. No mass lesion or mass effect. No hydrocephalus or extra-axial fluid collection. Vascular: No  hyperdense vessel. Skull: Scalp soft tissues and calvarium within normal limits. Sinuses: Paranasal sinuses and mastoid air cells are clear. Orbits: Globes and orbital soft  tissues demonstrate no acute finding. Review of the MIP images confirms the above findings CTA NECK FINDINGS Aortic arch: Visualized aortic arch normal in caliber with normal branch pattern. Mild-to-moderate atheromatous change about the origin of the great vessels. Visualized subclavian arteries patent. Right carotid system: Right CCA patent from its origin to the bifurcation without stenosis. Eccentric calcified plaque at the right carotid bulb/proximal right ICA without hemodynamically significant stenosis. Right ICA patent distally without stenosis, dissection or occlusion. Left carotid system: Atheromatous plaque at the origin of the left CCA with associated stenosis of up to 55% by NASCET criteria. There is focal intimal irregularity at the ostium of the left CCA, which could reflect changes of a recently ruptured plaque (series 11, image 326). Finding could serve as a possible embolic source. Left CCA patent distally to the bifurcation. Eccentric calcified plaque at the left carotid bulb/proximal left ICA without significant stenosis. Left ICA patent distally without significant stenosis, dissection or occlusion. Vertebral arteries: Left vertebral artery arises directly from the aortic arch, right vertebral artery arises from the right subclavian artery. Atheromatous plaque at the origin of the right vertebral artery with approximate 70% stenosis. Vertebral arteries otherwise patent within the neck without stenosis, dissection or occlusion. Skeleton: No visible acute osseous finding. No discrete or worrisome osseous lesions. 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 Other neck: No other acute soft tissue abnormality within the neck. No mass or adenopathy. Incidental note made of a 1 cm left thyroid nodule, felt to be of doubtful significance given size and patient age. No follow-up imaging recommended regarding this lesion (ref: J Am Coll Radiol. 2015 Feb;12(2): 143-50). Upper chest: Small  focus of ground-glass opacity at the mid left upper lobe noted, likely a small focus of atelectasis. Visualized upper chest demonstrates no other acute finding. Review of the MIP images confirms the above findings CTA HEAD FINDINGS Anterior circulation: Petrous segments patent bilaterally. Atheromatous change throughout the carotid siphons with associated mild diffuse narrowing. A1 segments patent bilaterally. Normal anterior communicating artery complex. Anterior cerebral arteries patent to their distal aspects without stenosis. No M1 stenosis or occlusion. Normal MCA bifurcations. Distal MCA branches well perfused and symmetric. Posterior circulation: Both vertebral arteries patent to the vertebrobasilar junction without stenosis. Both PICA origins patent and normal. Basilar widely patent to its distal aspect. Superior cerebral arteries patent bilaterally. Both PCAs well perfused to their distal aspects without stenosis. Venous sinuses: Patent. Anatomic variants: None significant.  No aneurysm. Review of the MIP images confirms the above findings IMPRESSION: CT HEAD IMPRESSION: 1. No acute intracranial abnormality. Recently identified subcentimeter ischemic infarcts not visible by CT. 2. Age-related cerebral atrophy with chronic small vessel ischemic disease. CTA HEAD AND NECK IMPRESSION: 1. Negative CTA for large vessel occlusion. 2. Atheromatous plaque at the origin of the left CCA with associated stenosis of up to 55% by NASCET criteria. Focal intimal irregularity at this level could reflect changes of a recently ruptured plaque, which could serve as a possible embolic source. 3. 70% atheromatous stenosis at the origin of the right vertebral artery. Posterior circulation otherwise widely patent. Electronically Signed   By: Rise Mu M.D.   On: 11/18/2020 23:41   PHYSICAL EXAM Neurologic exam Mental status/Cognition: Alert, oriented to self, place, month and year,mildly  distractible. Speech/language:Mild WFD, comprehension intact, object naming intact, repetition  intact.  Cranial nerves: CN II Pupils equal and reactive to light, no VF deficits   CN III,IV,VI EOM intact, no gaze preference or deviation, no nystagmus   CN V normal sensation in V1, V2, and V3 segments bilaterally   CN VII no asymmetry, no nasolabial fold flattening   CN VIII normal hearing to speech   CN IX &X normal palatal elevation, no uvular deviation   CN XI 5/5 head turn and 5/5 shoulder shrug bilaterally   CN XII midline tongue protrusion    Motor:Normal bulk.No tremor no tremor, rigidity, or bradykinesia. No pronator drift. 5/5 strength x all 4 extrem.  Reflexes:1+ and symmetric throughout BUE and BLE except absent achilles bilat.  Sensation:Intact to LT, PP throughout. Length-dependent impairment to vib BLE.  Coordination: intact FNF bilat, intact RAM  Gait: deferred    ASSESSMENT/PLAN 65 yo woman with hx TBI 2020 with subsequent headaches and possible spells of staring / losing time, DM2, HTN, tobacco abuse who was [facility] y[facility], 2022 in order to get an MRI of the brain to r/o acute ischemic stroke as etiology for new word-finding difficulty x2-3 days. [facility].   Stroke:  Punctate acute infarction at the left frontal gray-white junction.  Question few other punctate foci of restricted diffusion in the left frontal operculum, deep insula and radiating white matter tracts on the left. Low level enhancement in the deep insula and radiating white matter tracts on the left, probably indicating that these are late acute or subacute infarctions. Etiology may be recurrent embolic infarctions.   -CTA head and neck: Negative CTA for large vessel occlusion. Atheromatous plaque at the origin of the left CCA with associated stenosis of up to 55% by NASCET criteria. Focal intimal irregularity at this level  could reflect changes of a recently ruptured plaque, which could serve as a possible embolic source. 70% atheromatous stenosis at the origin of the right vertebral artery. Posterior circulation otherwise widely patent. -MRI Motion degraded exam Question few other punctate foci of restricted diffusion in the left frontal operculum, deep insula and radiating white matter tracts on the left. Low level enhancement in the deep insula and radiating white matter tracts on the left, probably indicating that these are late acute or subacute infarctions. Given the acute infarction in theleft frontal lobe, punctate infarctions of differing ages suggest recurrent embolic infarctions, possibly from the left carotid bifurcation in this case. Moderate to advanced chronic small-vessel ischemic changes elsewhere throughout the brain. -2D Echo  EF 60%-65%, No thrombus, wall motion abnormality or shunt found. Grade I diastolic dysfunction was noted.   -Cartoid Doppler study is  PENDING  LDL 117  HgbA1c 12.0  VTE prophylaxis - discharging today     Diet   Diet heart healthy/carb modified Room service appropriate? Yes; Fluid consistency: Thin     ASA 81mg  and Plavix 75mg  daily x 3 weeks then ASA alone  Therapy recommendations:  Outpatient therapies   Disposition:  Home with Outpatient PT, OT and ST  Hypertension . Permissive hypertension (OK if < 220/120) but gradually normalize in 5-7 days . Long-term BP goal normotensive  Hyperlipidemia  Home meds:  None  LDL 117, goal < 70  High intensity statin: Lipitor 80mg  daily   Continue statin at discharge  Diabetes type II Uncontrolled  Management per primary team  HgbA1c 12.0, goal < 7.0  CBGs Recent Labs    11/18/20 2109 11/19/20 0612 11/19/20 1140  GLUCAP  202* 166* 233*      SSI  Close follow up with PCP to manage  Other Stroke Risk Factors  Current every day cigarette smoker. Advised to stop smoking.   Obesity, Body mass  index is 40.21 kg/m., BMI >/= 30 associated with increased stroke risk, recommend weight loss, diet and exercise as appropriate   Hx stroke noted on MRI  Other Active Problems    Hospital day # 3  I have personally obtained history,examined this patient, reviewed notes, independently viewed imaging studies, participated in medical decision making and plan of care.ROS completed by me personally and pertinent positives fully documented  I have made any additions or clarifications directly to the above note. Agree with note above.  She presented with expressive aphasia and MRI scan shows by cerebral tiny infarcts   likely from central cardiac origin.  Recommend dual antiplatelet therapy for 3 weeks followed by aspirin alone and aggressive risk factor modification.  Check carotid ultrasound.  She will likely need TEE and loop recorder prior to discharge . Greater than 50% time during this 35-minute visit was spent in counseling and coordination of care about her embolic strokes and discussion about evaluation treatment.discussed with Dr Binnie Kand, MD Medical Director [facility] Pager: [phone] 11/19/2020 5:13 PM   To contact Stroke Continuity provider, please refer to [email or web address]. After hours, contact General Neurology

## 2020-11-19 NOTE — Plan of Care (Signed)
  Problem: Education: Goal: Knowledge of General Education information will improve Description: Including pain rating scale, medication(s)/side effects and non-pharmacologic comfort measures Outcome: Adequate for Discharge   Problem: Health Behavior/Discharge Planning: Goal: Ability to manage health-related needs will improve Outcome: Adequate for Discharge   Problem: Clinical Measurements: Goal: Ability to maintain clinical measurements within normal limits will improve Outcome: Adequate for Discharge Goal: Will remain free from infection Outcome: Adequate for Discharge Goal: Diagnostic test results will improve Outcome: Adequate for Discharge Goal: Respiratory complications will improve Outcome: Adequate for Discharge Goal: Cardiovascular complication will be avoided Outcome: Adequate for Discharge   Problem: Activity: Goal: Risk for activity intolerance will decrease Outcome: Adequate for Discharge   Problem: Nutrition: Goal: Adequate nutrition will be maintained Outcome: Adequate for Discharge   Problem: Coping: Goal: Level of anxiety will decrease Outcome: Adequate for Discharge   Problem: Elimination: Goal: Will not experience complications related to bowel motility Outcome: Adequate for Discharge Goal: Will not experience complications related to urinary retention Outcome: Adequate for Discharge   Problem: Pain Managment: Goal: General experience of comfort will improve Outcome: Adequate for Discharge   Problem: Safety: Goal: Ability to remain free from injury will improve Outcome: Adequate for Discharge   Problem: Skin Integrity: Goal: Risk for impaired skin integrity will decrease Outcome: Adequate for Discharge   Problem: Education: Goal: Knowledge of disease or condition will improve Outcome: Adequate for Discharge Goal: Knowledge of secondary prevention will improve Outcome: Adequate for Discharge Goal: Knowledge of patient specific risk factors  addressed and post discharge goals established will improve Outcome: Adequate for Discharge Goal: Individualized Educational Video(s) Outcome: Adequate for Discharge   Problem: Coping: Goal: Will verbalize positive feelings about self Outcome: Adequate for Discharge Goal: Will identify appropriate support needs Outcome: Adequate for Discharge   Problem: Health Behavior/Discharge Planning: Goal: Ability to manage health-related needs will improve Outcome: Adequate for Discharge   Problem: Self-Care: Goal: Ability to participate in self-care as condition permits will improve Outcome: Adequate for Discharge

## 2020-11-19 NOTE — Progress Notes (Addendum)
Inpatient Diabetes Program Recommendations  AACE/ADA: New Consensus Statement on Inpatient Glycemic Control (2015)  Target Ranges:  Prepandial:   less than 140 mg/dL      Peak postprandial:   less than 180 mg/dL (1-2 hours)      Critically ill patients:  140 - 180 mg/dL   Lab Results  Component Value Date   GLUCAP 166 (H) 11/19/2020   HGBA1C 12.0 (H) 11/16/2020    Review of Glycemic Control Results for NARIAH, MORGANO (MRN [id number]) as of 11/19/2020 09:39  Ref. Range 11/18/2020 09:51 11/18/2020 11:49 11/18/2020 16:48 11/18/2020 21:09 11/19/2020 06:12  Glucose-Capillary Latest Ref Range: 70 - 99 mg/dL 212 (H) 283 (H) 141 (H) 202 (H) 166 (H)   Diabetes history: DM 2 Outpatient Diabetes medications: Metformin (has not had in 1 year) Current orders for Inpatient glycemic control:  Lantus 20 units Daily Novolog 0-15 units tid + hs  A1c 12%  Fasting glucose 160's this am watch trends today.  Inpatient Diabetes Program Recommendations:    Need follow up at clinic in order to get insulin pt has no insurance or PCP.  Looking at current glucose trends and affordability pt would most likely benefit converting to 70/30 insulin at time of d/c as she would most likely need meal coverage in addition to basal insulin with current 20 units of basal insulin 70/30 dose is 15 units bid.    D/c:  Insulin pen needles order # [id number] Glucose meter kit order # [id number]  Thanks,  Tama Headings RN, MSN, BC-ADM Inpatient Diabetes Coordinator Team Pager [phone] (8a-5p)

## 2020-11-19 NOTE — Progress Notes (Signed)
Occupational Therapy Treatment Patient Details Name: Olivia Bell MRN: [id number] DOB: 07-Mar-1956 Today's Date: 11/19/2020    History of present illness Pt admitted from [facility] on 3/11 for MRI of brain due to several day history of "not feeling right" followed by aphasia. 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 PMH: HTN, COPD, DM2, smoker, TBI in 2021.   OT comments  Patient supine in bed and agreeable for OT.  Patient fatigues easily with minimal activity, reviewed safety with RW, fall prevention, and energy conservation techniques. Patient completing transfers with min guard, LB dressing and grooming with min guard.  Highly recommend assist for showers/bathing at home with 3:1 or shower chair, pt agreeable to recommendations.  Will follow acutely.    Follow Up Recommendations  Home health OT;Supervision/Assistance - 24 hour    Equipment Recommendations  3 in 1 bedside commode;Tub/shower bench    Recommendations for Other Services PT consult;Speech consult    Precautions / Restrictions Precautions Precautions: Fall Precaution Comments: Baseline dizziness from TBI Restrictions Weight Bearing Restrictions: No       Mobility Bed Mobility Overal bed mobility: Needs Assistance Bed Mobility: Supine to Sit;Sit to Supine     Supine to sit: Supervision Sit to supine: Supervision        Transfers Overall transfer level: Needs assistance Equipment used: Rolling walker (2 wheeled) Transfers: Sit to/from Stand Sit to Stand: Min guard         General transfer comment: min guard for safety, cueing for hand placement and RW safety    Balance Overall balance assessment: Needs assistance;History of Falls Sitting-balance support: Feet supported;No upper extremity supported Sitting balance-Leahy Scale: Good      Standing balance support: During functional activity;Bilateral upper extremity supported;No upper extremity supported Standing balance-Leahy Scale: Poor Standing balance comment: reliant on UE support dynamically, min guard for no UE support during ADLs                           ADL either performed or assessed with clinical judgement   ADL Overall ADL's : Needs assistance/impaired     Grooming: Oral care;Min guard;Standing               Lower Body Dressing: Min guard;Sit to/from stand Lower Body Dressing Details (indicate cue type and reason): to adjust socks, min guard sit to stand Toilet Transfer: Min guard;Ambulation;Regular Toilet;Grab bars;RW           Functional mobility during ADLs: Min guard;Rolling walker General ADL Comments: patient limited by decreased activity tolerance, cognition, and balance.  Reviewed energy conservation techniques, poor safety awareness and requires cueing for problem solving     Vision       Perception     Praxis      Cognition Arousal/Alertness: Awake/alert Behavior During Therapy: WFL for tasks assessed/performed Overall Cognitive Status: Impaired/Different from baseline Area of Impairment: Problem solving;Safety/judgement                         Safety/Judgement: Decreased awareness of deficits;Decreased awareness of safety   Problem Solving: Slow processing;Requires verbal cues General Comments: increased time for problem solving and processing        Exercises     Shoulder Instructions       General Comments      Pertinent  Vitals/ Pain       Pain Assessment: No/denies pain  Home Living                                          Prior Functioning/Environment              Frequency  Min 2X/week        Progress Toward Goals  OT Goals(current goals can now be found in the care plan section)  Progress towards OT goals: Progressing toward goals  Acute Rehab OT  Goals Patient Stated Goal: return home OT Goal Formulation: With patient  Plan Discharge plan remains appropriate;Frequency remains appropriate    Co-evaluation                 AM-PAC OT "6 Clicks" Daily Activity     Outcome Measure   Help from another person eating meals?: None Help from another person taking care of personal grooming?: A Little Help from another person toileting, which includes using toliet, bedpan, or urinal?: A Little Help from another person bathing (including washing, rinsing, drying)?: A Little Help from another person to put on and taking off regular upper body clothing?: None Help from another person to put on and taking off regular lower body clothing?: A Little 6 Click Score: 20    End of Session Equipment Utilized During Treatment: Rolling walker  OT Visit Diagnosis: Other abnormalities of gait and mobility (R26.89);Unsteadiness on feet (R26.81);Muscle weakness (generalized) (M62.81);Pain;Cognitive communication deficit (R41.841)   Activity Tolerance Patient tolerated treatment well   Patient Left in bed;with call bell/phone within reach;with bed alarm set   Nurse Communication Mobility status        Time: 3267-1245 OT Time Calculation (min): 32 min  Charges: OT Treatments $Self Care/Home Management : 23-37 mins  Barry Brunner, OT Acute Rehabilitation Services Pager [phone] Office [phone]    [name] 11/19/2020, 3:59 PM

## 2020-11-19 NOTE — Progress Notes (Signed)
Physical Therapy Treatment Patient Details Name: Olivia Bell MRN: [id number] DOB: Apr 07, 1956 Today's Date: 11/19/2020    History of Present Illness Pt admitted from [facility] on 3/11 for MRI of brain due to several day history of "not feeling right" followed by aphasia. 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 PMH: HTN, COPD, DM2, smoker, TBI in 2021.    PT Comments    Session focused on improving gait quality with introduction of RW. Patient requires minA for mobility with SPC for balance as patient drifts L/R and loses balance laterally with ambulation. Patient ambulated 25' with RW and min guard, no LOB noted and improved gait quality. Encouraged use of RW upon returning home for safety and reducing fall risk. Continue to recommend HHPT following d/c.     Follow Up Recommendations  Home health PT;Supervision for mobility/OOB     Equipment Recommendations  Rolling walker with 5" wheels    Recommendations for Other Services       Precautions / Restrictions Precautions Precautions: Fall Precaution Comments: Baseline dizziness from TBI Restrictions Weight Bearing Restrictions: No    Mobility  Bed Mobility Overal bed mobility: Needs Assistance Bed Mobility: Supine to Sit;Sit to Supine     Supine to sit: Supervision Sit to supine: Supervision        Transfers Overall transfer level: Needs assistance Equipment used: None Transfers: Sit to/from Stand Sit to Stand: Min guard         General transfer comment: minguard for safety, no physical assist required  Ambulation/Gait Ambulation/Gait assistance: Min assist Gait Distance (Feet): 100 Feet (x 25') Assistive device: Straight cane;Rolling walker (2 wheeled) Gait Pattern/deviations: Step-through pattern;Decreased stride length;Trunk flexed;Drifts  right/left Gait velocity: decreased   General Gait Details: Drifts L and R with SPC. Requires minA for balance throughout. Trialed use of RW for mobility with improved gait quality and balance requiring min guard   Stairs             Wheelchair Mobility    Modified Rankin (Stroke Patients Only) Modified Rankin (Stroke Patients Only) Pre-Morbid Rankin Score: Slight disability Modified Rankin: Moderately severe disability     Balance Overall balance assessment: Needs assistance;History of Falls Sitting-balance support: Feet supported;No upper extremity supported Sitting balance-Leahy Scale: Good     Standing balance support: During functional activity;Single extremity supported Standing balance-Leahy Scale: Poor Standing balance comment: reliant on UE support and reaching for objects with opposite hand of SPC                            Cognition Arousal/Alertness: Awake/alert Behavior During Therapy: WFL for tasks assessed/performed Overall Cognitive Status: Impaired/Different from baseline Area of Impairment: Problem solving;Safety/judgement                         Safety/Judgement: Decreased awareness of deficits;Decreased awareness of safety   Problem Solving: Slow processing;Requires verbal cues        Exercises General Exercises - Lower Extremity Long Arc Quad: Both;10 reps;Seated Hip Flexion/Marching: Both;10 reps;Seated    General Comments        Pertinent Vitals/Pain Pain Assessment: No/denies pain    Home Living                      Prior Function  PT Goals (current goals can now be found in the care plan section) Acute Rehab PT Goals Patient Stated Goal: return home PT Goal Formulation: With patient Time For Goal Achievement: 12/01/20 Potential to Achieve Goals: Good Progress towards PT goals: Progressing toward goals    Frequency    Min 4X/week      PT Plan Current plan remains  appropriate    Co-evaluation              AM-PAC PT "6 Clicks" Mobility   Outcome Measure  Help needed turning from your back to your side while in a flat bed without using bedrails?: A Little Help needed moving from lying on your back to sitting on the side of a flat bed without using bedrails?: A Little Help needed moving to and from a bed to a chair (including a wheelchair)?: A Little Help needed standing up from a chair using your arms (e.g., wheelchair or bedside chair)?: A Little Help needed to walk in hospital room?: A Little Help needed climbing 3-5 steps with a railing? : A Little 6 Click Score: 18    End of Session Equipment Utilized During Treatment: Gait belt Activity Tolerance: Patient tolerated treatment well Patient left: in bed;with call bell/phone within reach;with bed alarm set Nurse Communication: Mobility status PT Visit Diagnosis: Unsteadiness on feet (R26.81);Difficulty in walking, not elsewhere classified (R26.2);Dizziness and giddiness (R42)     Time: 2774-1287 PT Time Calculation (min) (ACUTE ONLY): 25 min  Charges:  $Gait Training: 8-22 mins $Therapeutic Activity: 8-22 mins                     Alayna A. Dan Humphreys PT, DPT Acute Rehabilitation Services Pager [phone] Office [phone]    Viviann Spare 11/19/2020, 11:45 AM

## 2020-11-19 NOTE — Progress Notes (Signed)
Carotid completed   Please see CV Proc for preliminary results.   Samuel Rittenhouse, RVT  

## 2020-11-19 NOTE — TOC Transition Note (Signed)
Transition of Care [facility]) - CM/SW Discharge Note   Patient Details  Name: Olivia Bell MRN: [id number] Date of Birth: 16-Mar-1956  [facility] CM/SW Contact:  Kermit Balo, RN Phone Number: 11/19/2020, 1:13 PM   Clinical Narrative:    Patient discharging home with charity [facility] services through [facility].  Pt without insurance. Discharge medications provided to the patient via [facility] program.  Pt has information on AVS to arrange PCP. She wants to see her sister at home before she schedules an appt.  Pt has transportation home today.   Final next level of care: Home w Home Health Services Barriers to Discharge: Insurance Authorization   Patient Goals and CMS Choice   CMS Medicare.gov Compare Post Acute Care list provided to:: Patient Choice offered to / list presented to : Patient  Discharge Placement                       Discharge Plan and Services   Discharge Planning Services: CM Consult            DME Arranged: Dan Humphreys rolling DME Agency: [employer] (charity services) Date DME Agency Contacted: 11/19/20   Representative spoke with at DME Agency: Velna Hatchet [facility] Arranged: PT,OT,Speech Therapy HH Agency: [facility] Date HH Agency Contacted: 11/19/20   Representative spoke with at [facility] Agency: [place]  Social Determinants of Health (SDOH) Interventions     Readmission Risk Interventions No flowsheet data found.

## 2020-11-19 NOTE — Discharge Summary (Signed)
Physician Discharge Summary  Olivia Bell IDP:[id number] DOB: 1956-07-14 DOA: 11/16/2020  PCP: Patient, No Pcp Per  Admit date: 11/16/2020 Discharge date: 11/19/2020  Admitted From: home Disposition:  home  Recommendations for Outpatient Follow-up:  1. Follow up with PCP in 1-2 weeks 2. Please obtain BMP/CBC in one week 3. Please follow up on the following pending results:  Home Health: PT, OT Equipment/Devices: walker  Discharge Condition: stable CODE STATUS: Full code Diet recommendation: heart healthy  HPI: Per admitting MD, Olivia Bell is a 65 y.o. female with medical history significant for diabetes mellitus type II, hypertension, tobacco abuse who presents from [facility] in transfer for MRI of the brain.  Patient reports that she has not been feeling "right" the last 2 to 3 days.  She states this started as feeling like she was off balance and generalized weakness.  On Saturday, November 15, 2020 she laid down to take a nap in the afternoon and felt normal but when she woke up a few hours later she was not able to speak.  She states she had difficulty finding and saying the right words and her speech sounded thickened.  The symptoms have improved over the last 24 hours but she still struggles to find the right words to say and is speaking slower than normal she states.  She is able to move all of her extremities.  She reports she has not had any syncope, seizure activity, numbness of her face or extremities or drooping of her face.  Denies any chest pain, shortness of breath, palpitations, orthopnea, abdominal pain, nausea, vomiting, diarrhea, dysuria.  She has not had any fever or cough.  She initially went to [facility] ER and had a CT of the head that was negative and a CT a of the head and neck which showed no large vessel occlusions.  Patient was transferred to [facility] for MRI of the brain and neurology consult if the MRI is positive. She reports that she was seeing  a Risk analyst in the past but her case was stopped a year ago and she has not seen any physician since then.  She reports she used to take for 5 medications for diabetes and blood pressure but she has not been on any medication for the last year and she has not been to a doctor and had refills given.  She states she has not been checking her blood pressure or blood sugars at home.  She states she does not follow any particular diet.  He does report that she has used albuterol in the past for breathing but she is not sure if she was ever tested for or treated for COPD. She lives with her sister.  She smokes a pack of cigarettes a day.  Hospital Course / Discharge diagnoses: Principal problem Expressive Aphasia   Acute Stroke -MRI brain motion degrated, but notable for punctate acute infarction at the L frontal gray white junction - ? Few other punctate foci of restricted diffusion in the L frontal operculum, deep insula, and radiating white matter tracts on the L.  Low level enhancement in the deep insula and radiating white matter tracts on the left (probably indicating these are late acute or subacute infarctions).  Given acute infarctio in the L fruntal lobe, punctate infarctions of differing ages suggest recurrent embolic infarctions, possibly from the L carotid bifurcation in this case. Echo with EF 44-31%, grade 1 diastolic dysfunction. A1C 12, LDL 117. Discussed with neurology,  recommending Aspirin and Plavix for 21 days followed by Aspirin alone. She will be set up with an event monitor as an outpatient. I am concerned about her non compliance in the past and discussed importance to follow up and take all medications as prescribed.  Active problems Vertigo   History of Traumatic Brain Injury -Vestibular rehab Hypertension-appears to have clonidine as home medication however has not been taking any meds for the past year. Stop clonidine given history of non compliance, start  Amlodipine T2DM -Start insulin, refilled her home metformin.  Tobacco Abuse -Smoking cessation, nicotine patch ?COPD -Needs spirometry outpatient  Sepsis ruled out   Discharge Instructions   Allergies as of 11/19/2020      Reactions   Bee Venom Swelling   Penicillins Swelling      Medication List    STOP taking these medications   aspirin 81 MG chewable tablet Replaced by: aspirin 81 MG EC tablet   benzonatate 200 MG capsule Commonly known as: TESSALON   cloNIDine 0.1 MG tablet Commonly known as: CATAPRES   dextromethorphan-guaiFENesin 30-600 MG 12hr tablet Commonly known as: MUCINEX DM   meclizine 25 MG tablet Commonly known as: ANTIVERT   ondansetron 8 MG disintegrating tablet Commonly known as: ZOFRAN-ODT   pantoprazole 40 MG tablet Commonly known as: Protonix     TAKE these medications   acetaminophen 500 MG tablet Commonly known as: TYLENOL Take 1,000 mg by mouth every 6 (six) hours as needed for mild pain.   albuterol (2.5 MG/3ML) 0.083% nebulizer solution Commonly known as: PROVENTIL Take 3 mLs (2.5 mg total) by nebulization every 4 (four) hours as needed for wheezing or shortness of breath.   aspirin 81 MG EC tablet Take 1 tablet (81 mg total) by mouth daily. Swallow whole. Start taking on: November 20, 2020 Replaces: aspirin 81 MG chewable tablet   atorvastatin 80 MG tablet Commonly known as: LIPITOR Take 1 tablet (80 mg total) by mouth daily. Start taking on: November 20, 2020   blood glucose meter kit and supplies Dispense based on patient and insurance preference. Use up to four times daily as directed. (FOR ICD-10 E10.9, E11.9).   clopidogrel 75 MG tablet Commonly known as: PLAVIX Take 1 tablet (75 mg total) by mouth daily. Start taking on: November 20, 2020   fluticasone 50 MCG/ACT nasal spray Commonly known as: FLONASE Place 1 spray into both nostrils daily as needed for allergies.   Flutter Devi Use as directed   insulin aspart  protamine - aspart (70-30) 100 UNIT/ML FlexPen Commonly known as: NOVOLOG 70/30 MIX Inject 0.12 mLs (12 Units total) into the skin 2 (two) times daily with a meal.   metFORMIN 1000 MG tablet Commonly known as: GLUCOPHAGE Take 1 tablet (1,000 mg total) by mouth 2 (two) times daily with a meal.   nicotine 14 mg/24hr patch Commonly known as: NICODERM CQ - dosed in mg/24 hours Place 1 patch (14 mg total) onto the skin daily. Start taking on: November 20, 2020   POTASSIUM PO Take 1 tablet by mouth daily. 99 mg   vitamin C 100 MG tablet Take 100 mg by mouth daily.   VITAMIN D3 PO Take 1 tablet by mouth daily.   zinc gluconate 50 MG tablet Take 50 mg by mouth daily.            Durable Medical Equipment  (From admission, onward)         Start     Ordered   11/19/20 1035  For home use only DME Walker rolling  Once       Question Answer Comment  Walker: With [facility]   Patient needs a walker to treat with the following condition Stroke [facility])      11/19/20 1034          Follow-up [place] in [facility] Follow up.   Why: Please call to schedule an appointment. may have to fill out paper work prior to appointment Contact information: [place] Follow up.   Why: Please use this location for PCP if have transport--also they assist with the cost of meds. Contact information: [place] [address] [place], [place] Follow up.   Why: May try this location also for PCP.  Contact information: [place] [address] [phone]        [facility], [place] Follow up.   Specialty: Home Health Services Why: The home health agency will contact you for the first home visit. It may be up to 10 days before they are able to make a home visit. Contact information: [address] [place]  [address] [phone]               Consultations:  Neurology   Procedures/Studies:  CT ANGIO HEAD W OR WO CONTRAST  Result Date: 11/18/2020 CLINICAL DATA:  Follow-up examination for acute stroke. EXAM: CT ANGIOGRAPHY HEAD AND NECK TECHNIQUE: Multidetector CT imaging of the head and neck was performed using the standard protocol during bolus administration of intravenous contrast. Multiplanar CT image reconstructions and MIPs were obtained to evaluate the vascular anatomy. Carotid stenosis measurements (when applicable) are obtained utilizing NASCET criteria, using the distal internal carotid diameter as the denominator. CONTRAST:  84m OMNIPAQUE IOHEXOL 350 MG/ML SOLN COMPARISON:  Prior MRI from 11/17/2020 FINDINGS: CT HEAD FINDINGS Brain: Age-related cerebral atrophy with moderate chronic microvascular ischemic disease. Previously identified scattered punctate ischemic infarcts not visible by CT. No other acute large vessel territory infarct. No intracranial hemorrhage. No mass lesion or mass effect. No hydrocephalus or extra-axial fluid collection. Vascular: No hyperdense vessel. Skull: Scalp soft tissues and calvarium within normal limits. Sinuses: Paranasal sinuses and mastoid air cells are clear. Orbits: Globes and orbital soft tissues demonstrate no acute finding. Review of the MIP images confirms the above findings CTA NECK FINDINGS Aortic arch: Visualized aortic arch normal in caliber with normal branch pattern. Mild-to-moderate atheromatous change about the origin of the great vessels. Visualized subclavian arteries patent. Right carotid system: Right CCA patent from its origin to the bifurcation without stenosis. Eccentric calcified plaque at the right carotid bulb/proximal right ICA without hemodynamically significant stenosis. Right ICA patent distally without stenosis, dissection or occlusion. Left carotid system: Atheromatous plaque at the origin of the left CCA with  associated stenosis of up to 55% by NASCET criteria. There is focal intimal irregularity at the ostium of the left CCA, which could reflect changes of a recently ruptured plaque (series 11, image 326). Finding could serve as a possible embolic source. Left CCA patent distally to the bifurcation. Eccentric calcified plaque at the left carotid bulb/proximal left ICA without significant stenosis. Left ICA patent distally without significant stenosis, dissection or occlusion. Vertebral arteries: Left vertebral artery arises directly from the aortic arch, right vertebral artery arises from the  right subclavian artery. Atheromatous plaque at the origin of the right vertebral artery with approximate 70% stenosis. Vertebral arteries otherwise patent within the neck without stenosis, dissection or occlusion. Skeleton: No visible acute osseous finding. No discrete or worrisome osseous lesions. 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 Other neck: No other acute soft tissue abnormality within the neck. No mass or adenopathy. Incidental note made of a 1 cm left thyroid nodule, felt to be of doubtful significance given size and patient age. No follow-up imaging recommended regarding this lesion (ref: J Am Coll Radiol. 2015 Feb;12(2): 143-50). Upper chest: Small focus of ground-glass opacity at the mid left upper lobe noted, likely a small focus of atelectasis. Visualized upper chest demonstrates no other acute finding. Review of the MIP images confirms the above findings CTA HEAD FINDINGS Anterior circulation: Petrous segments patent bilaterally. Atheromatous change throughout the carotid siphons with associated mild diffuse narrowing. A1 segments patent bilaterally. Normal anterior communicating artery complex. Anterior cerebral arteries patent to their distal aspects without stenosis. No M1 stenosis or occlusion. Normal MCA bifurcations. Distal MCA branches well perfused and symmetric. Posterior circulation:  Both vertebral arteries patent to the vertebrobasilar junction without stenosis. Both PICA origins patent and normal. Basilar widely patent to its distal aspect. Superior cerebral arteries patent bilaterally. Both PCAs well perfused to their distal aspects without stenosis. Venous sinuses: Patent. Anatomic variants: None significant.  No aneurysm. Review of the MIP images confirms the above findings IMPRESSION: CT HEAD IMPRESSION: 1. No acute intracranial abnormality. Recently identified subcentimeter ischemic infarcts not visible by CT. 2. Age-related cerebral atrophy with chronic small vessel ischemic disease. CTA HEAD AND NECK IMPRESSION: 1. Negative CTA for large vessel occlusion. 2. Atheromatous plaque at the origin of the left CCA with associated stenosis of up to 55% by NASCET criteria. Focal intimal irregularity at this level could reflect changes of a recently ruptured plaque, which could serve as a possible embolic source. 3. 70% atheromatous stenosis at the origin of the right vertebral artery. Posterior circulation otherwise widely patent. Electronically Signed   By: Jeannine Boga M.D.   On: 11/18/2020 23:41   CT ANGIO NECK W OR WO CONTRAST  Result Date: 11/18/2020 CLINICAL DATA:  Follow-up examination for acute stroke. EXAM: CT ANGIOGRAPHY HEAD AND NECK TECHNIQUE: Multidetector CT imaging of the head and neck was performed using the standard protocol during bolus administration of intravenous contrast. Multiplanar CT image reconstructions and MIPs were obtained to evaluate the vascular anatomy. Carotid stenosis measurements (when applicable) are obtained utilizing NASCET criteria, using the distal internal carotid diameter as the denominator. CONTRAST:  36m OMNIPAQUE IOHEXOL 350 MG/ML SOLN COMPARISON:  Prior MRI from 11/17/2020 FINDINGS: CT HEAD FINDINGS Brain: Age-related cerebral atrophy with moderate chronic microvascular ischemic disease. Previously identified scattered punctate ischemic  infarcts not visible by CT. No other acute large vessel territory infarct. No intracranial hemorrhage. No mass lesion or mass effect. No hydrocephalus or extra-axial fluid collection. Vascular: No hyperdense vessel. Skull: Scalp soft tissues and calvarium within normal limits. Sinuses: Paranasal sinuses and mastoid air cells are clear. Orbits: Globes and orbital soft tissues demonstrate no acute finding. Review of the MIP images confirms the above findings CTA NECK FINDINGS Aortic arch: Visualized aortic arch normal in caliber with normal branch pattern. Mild-to-moderate atheromatous change about the origin of the great vessels. Visualized subclavian arteries patent. Right carotid system: Right CCA patent from its origin to the bifurcation without stenosis. Eccentric calcified plaque at the right carotid bulb/proximal right  ICA without hemodynamically significant stenosis. Right ICA patent distally without stenosis, dissection or occlusion. Left carotid system: Atheromatous plaque at the origin of the left CCA with associated stenosis of up to 55% by NASCET criteria. There is focal intimal irregularity at the ostium of the left CCA, which could reflect changes of a recently ruptured plaque (series 11, image 326). Finding could serve as a possible embolic source. Left CCA patent distally to the bifurcation. Eccentric calcified plaque at the left carotid bulb/proximal left ICA without significant stenosis. Left ICA patent distally without significant stenosis, dissection or occlusion. Vertebral arteries: Left vertebral artery arises directly from the aortic arch, right vertebral artery arises from the right subclavian artery. Atheromatous plaque at the origin of the right vertebral artery with approximate 70% stenosis. Vertebral arteries otherwise patent within the neck without stenosis, dissection or occlusion. Skeleton: No visible acute osseous finding. No discrete or worrisome osseous lesions. Congenital fusion of  the C2 and C3 vertebral bodies noted. Other neck: No other acute soft tissue abnormality within the neck. No mass or adenopathy. Incidental note made of a 1 cm left thyroid nodule, felt to be of doubtful significance given size and patient age. No follow-up imaging recommended regarding this lesion (ref: J Am Coll Radiol. 2015 Feb;12(2): 143-50). Upper chest: Small focus of ground-glass opacity at the mid left upper lobe noted, likely a small focus of atelectasis. Visualized upper chest demonstrates no other acute finding. Review of the MIP images confirms the above findings CTA HEAD FINDINGS Anterior circulation: Petrous segments patent bilaterally. Atheromatous change throughout the carotid siphons with associated mild diffuse narrowing. A1 segments patent bilaterally. Normal anterior communicating artery complex. Anterior cerebral arteries patent to their distal aspects without stenosis. No M1 stenosis or occlusion. Normal MCA bifurcations. Distal MCA branches well perfused and symmetric. Posterior circulation: Both vertebral arteries patent to the vertebrobasilar junction without stenosis. Both PICA origins patent and normal. Basilar widely patent to its distal aspect. Superior cerebral arteries patent bilaterally. Both PCAs well perfused to their distal aspects without stenosis. Venous sinuses: Patent. Anatomic variants: None significant.  No aneurysm. Review of the MIP images confirms the above findings IMPRESSION: CT HEAD IMPRESSION: 1. No acute intracranial abnormality. Recently identified subcentimeter ischemic infarcts not visible by CT. 2. Age-related cerebral atrophy with chronic small vessel ischemic disease. CTA HEAD AND NECK IMPRESSION: 1. Negative CTA for large vessel occlusion. 2. Atheromatous plaque at the origin of the left CCA with associated stenosis of up to 55% by NASCET criteria. Focal intimal irregularity at this level could reflect changes of a recently ruptured plaque, which could serve as  a possible embolic source. 3. 70% atheromatous stenosis at the origin of the right vertebral artery. Posterior circulation otherwise widely patent. Electronically Signed   By: Jeannine Boga M.D.   On: 11/18/2020 23:41   MR BRAIN WO CONTRAST  Result Date: 11/17/2020 CLINICAL DATA:  Acute neurologic deficit EXAM: MRI HEAD WITHOUT CONTRAST TECHNIQUE: Multiplanar, multiecho pulse sequences of the brain and surrounding structures were obtained without intravenous contrast. COMPARISON:  None. FINDINGS: The complete examination protocol was not finished. There are 6 series provided for interpretation. There is no acute infarct. No midline shift or other mass effect. There is moderate white matter disease. IMPRESSION: Truncated examination. No acute infarct. Electronically Signed   By: Ulyses Jarred M.D.   On: 11/17/2020 00:53   MR BRAIN W WO CONTRAST  Result Date: 11/17/2020 CLINICAL DATA:  Word finding difficulty. Negative limited study earlier today. EXAM: MRI  HEAD WITHOUT AND WITH CONTRAST TECHNIQUE: Multiplanar, multiecho pulse sequences of the brain and surrounding structures were obtained without and with intravenous contrast. CONTRAST:  71m GADAVIST GADOBUTROL 1 MMOL/ML IV SOLN COMPARISON:  Limited study earlier same day.  CT studies 11/15/2020. FINDINGS: Brain: This examination also suffers from motion degradation. There is a definite acute punctate infarction at the left frontal gray-white junction. Question 1 or 2 other punctate foci of restricted diffusion in the left frontal operculum and radiating white matter tracts on the left. Those are not certain. Elsewhere, chronic small-vessel ischemic changes affect the pons. No focal cerebellar abnormality is seen. Moderate to advanced chronic small-vessel ischemic changes affect the cerebral hemispheric white matter. After contrast administration, there is a small focus of enhancement in the deep insula on the left probably corresponding to a late  acute/subacute infarction in that location. Similarly, low level enhancement is seen in the radiating white matter tracts on the left probably representing late acute/subacute infarction. No other abnormal enhancement is seen. No sign of mass, hemorrhage, hydrocephalus or extra-axial collection. Vascular: Major vessels at the base of the brain show flow. Skull and upper cervical spine: Negative Sinuses/Orbits: Clear/normal Other: None IMPRESSION: 1. Severely motion degraded exam. 2. Punctate acute infarction at the left frontal gray-white junction. Question few other punctate foci of restricted diffusion in the left frontal operculum, deep insula and radiating white matter tracts on the left. 3. Low level enhancement in the deep insula and radiating white matter tracts on the left, probably indicating that these are late acute or subacute infarctions. Given the acute infarction in the left frontal lobe, punctate infarctions of differing ages suggest recurrent embolic infarctions, possibly from the left carotid bifurcation in this case. 4. Moderate to advanced chronic small-vessel ischemic changes elsewhere throughout the brain. Electronically Signed   By: MNelson ChimesM.D.   On: 11/17/2020 18:39   EEG adult  Result Date: 11/17/2020 YLora Havens MD     11/18/2020  9:15 AM Patient Name: CMicha ErckMRN: [id number] Attending: PLora HavensReferring Physician/Provider: Dr. CFayrene HelperDate: 11/17/2020 Duration: 24.18 mins Patient history: 65year old female with speech disturbance.  EEG to evaluate for seizures. Level of alertness: Awake, asleep AEDs during EEG study: None Technical aspects: This EEG study was done with scalp electrodes positioned according to the 10-20 International system of electrode placement. Electrical activity was acquired at a sampling rate of '500Hz'  and reviewed with a high frequency filter of '70Hz'  and a low frequency filter of '1Hz' . EEG data were recorded continuously  and digitally stored. Description: The posterior dominant rhythm consists of 8.5 Hz activity of moderate voltage (25-35 uV) seen predominantly in posterior head regions, symmetric and reactive to eye opening and eye closing. Sleep was characterized by vertex waves, sleep spindles (12 to 14 Hz), maximal frontocentral region. EEG showed intermittent 3-4 Hz theta-delta slowing in left temporal region. Physiologic photic driving was not seen during photic stimulation.  Hyperventilation was not performed.   ABNORMALITY -Intermittent slow, left temporal region IMPRESSION: This study is suggestive of nonspecific cortical dysfunction in the temporal region. No seizures or epileptiform discharges were seen throughout the recording. PLora Havens  ECHOCARDIOGRAM COMPLETE  Result Date: 11/17/2020    ECHOCARDIOGRAM REPORT   Patient Name:   Olivia HIPPDate of Exam: 11/17/2020 Medical Rec #:  [id number]     Height:       66.0 in Accession #:    [id number]    Weight:  249.1 lb Date of Birth:  10-Jan-1956      BSA:          2.195 m Patient Age:    65 years       BP:           129/58 mmHg Patient Gender: F              HR:           72 bpm. Exam Location:  Inpatient Procedure: 2D Echo, 3D Echo, Cardiac Doppler, Color Doppler and Strain Analysis Indications:    CVA  History:        Patient has no prior history of Echocardiogram examinations.                 COPD, Signs/Symptoms:Chest Pain; Risk Factors:Diabetes and                 Hypertension.  Sonographer:    [place] Referring Phys: [id number] Lupus  1. Left ventricular ejection fraction, by estimation, is 60 to 65%. The left ventricle has normal function. The left ventricle has no regional wall motion abnormalities. Left ventricular diastolic parameters are consistent with Grade I diastolic dysfunction (impaired relaxation).  2. Right ventricular systolic function is normal. The right ventricular size is normal.  3. The mitral valve  is grossly normal. Trivial mitral valve regurgitation.  4. The aortic valve is normal in structure. Aortic valve regurgitation is not visualized. No aortic stenosis is present. FINDINGS  Left Ventricle: Left ventricular ejection fraction, by estimation, is 60 to 65%. The left ventricle has normal function. The left ventricle has no regional wall motion abnormalities. The left ventricular internal cavity size was normal in size. There is  no left ventricular hypertrophy. Left ventricular diastolic parameters are consistent with Grade I diastolic dysfunction (impaired relaxation). Right Ventricle: The right ventricular size is normal. No increase in right ventricular wall thickness. Right ventricular systolic function is normal. Left Atrium: Left atrial size was normal in size. Right Atrium: Right atrial size was normal in size. Pericardium: There is no evidence of pericardial effusion. Mitral Valve: The mitral valve is grossly normal. Trivial mitral valve regurgitation. MV peak gradient, 4.2 mmHg. The mean mitral valve gradient is 2.0 mmHg. Tricuspid Valve: The tricuspid valve is grossly normal. Tricuspid valve regurgitation is not demonstrated. Aortic Valve: The aortic valve is normal in structure. Aortic valve regurgitation is not visualized. No aortic stenosis is present. Aortic valve mean gradient measures 4.0 mmHg. Aortic valve peak gradient measures 7.8 mmHg. Aortic valve area, by VTI measures 4.20 cm. Pulmonic Valve: The pulmonic valve was not well visualized. Pulmonic valve regurgitation is not visualized. Aorta: The aortic root and ascending aorta are structurally normal, with no evidence of dilitation. IAS/Shunts: The atrial septum is grossly normal.  LEFT VENTRICLE PLAX 2D LVIDd:         5.10 cm  Diastology LVIDs:         3.60 cm  LV e' medial:    7.51 cm/s LV PW:         1.10 cm  LV E/e' medial:  8.9 LV IVS:        1.00 cm  LV e' lateral:   33.40 cm/s LVOT diam:     2.40 cm  LV E/e' lateral: 2.0 LV SV:          123 LV SV Index:   56       2D Longitudinal Strain LVOT Area:  4.52 cm 2D Strain GLS Avg:     -16.1 %  RIGHT VENTRICLE RV S prime:     12.20 cm/s TAPSE (M-mode): 3.0 cm LEFT ATRIUM             Index       RIGHT ATRIUM           Index LA diam:        4.00 cm 1.82 cm/m  RA Area:     13.70 cm LA Vol (A2C):   43.4 ml 19.77 ml/m RA Volume:   30.90 ml  14.07 ml/m LA Vol (A4C):   32.1 ml 14.62 ml/m LA Biplane Vol: 39.5 ml 17.99 ml/m  AORTIC VALVE                   PULMONIC VALVE AV Area (Vmax):    3.85 cm    PV Vmax:       0.96 m/s AV Area (Vmean):   4.00 cm    PV Vmean:      72.700 cm/s AV Area (VTI):     4.20 cm    PV VTI:        0.223 m AV Vmax:           140.00 cm/s PV Peak grad:  3.7 mmHg AV Vmean:          93.000 cm/s PV Mean grad:  2.0 mmHg AV VTI:            0.292 m AV Peak Grad:      7.8 mmHg AV Mean Grad:      4.0 mmHg LVOT Vmax:         119.00 cm/s LVOT Vmean:        82.200 cm/s LVOT VTI:          0.271 m LVOT/AV VTI ratio: 0.93  AORTA Ao Root diam: 3.20 cm Ao Asc diam:  3.10 cm MITRAL VALVE MV Area (PHT): 4.01 cm    SHUNTS MV Area VTI:   3.56 cm    Systemic VTI:  0.27 m MV Peak grad:  4.2 mmHg    Systemic Diam: 2.40 cm MV Mean grad:  2.0 mmHg MV Vmax:       1.03 m/s MV Vmean:      63.3 cm/s MV Decel Time: 189 msec MV E velocity: 66.80 cm/s MV A velocity: 93.80 cm/s MV E/A ratio:  0.71 Mertie Moores MD Electronically signed by Mertie Moores MD Signature Date/Time: 11/17/2020/11:56:07 AM    Final       Subjective: - no chest pain, shortness of breath, no abdominal pain, nausea or vomiting.   Discharge Exam: BP (!) 142/66 (BP Location: Right Arm)    Pulse 85    Temp 99.1 F (37.3 C) (Oral)    Resp 18    Ht '5\' 6"'  (1.676 m)    Wt 113 kg    SpO2 95%    BMI 40.21 kg/m   General: Pt is alert, awake, not in acute distress Cardiovascular: RRR, S1/S2 +, no rubs, no gallops Respiratory: CTA bilaterally, no wheezing, no rhonchi Abdominal: Soft, NT, ND, bowel sounds + Extremities: no  edema, no cyanosis  The results of significant diagnostics from this hospitalization (including imaging, microbiology, ancillary and laboratory) are listed below for reference.     Microbiology: No results found for this or any previous visit (from the past 240 hour(s)).   Labs: Basic Metabolic Panel: Recent Labs  Lab 11/16/20 2037 11/18/20 0230 11/19/20  0401  NA 131* 136 135  K 4.0 3.6 3.7  CL 97* 101 99  CO2 '29 29 27  ' GLUCOSE 234* 216* 149*  BUN '15 14 11  ' CREATININE 0.76 0.69 0.63  CALCIUM 8.5* 8.7* 8.7*  MG 1.9 1.7 1.4*  PHOS  --  3.6 3.2   Liver Function Tests: Recent Labs  Lab 11/16/20 2037 11/18/20 0230 11/19/20 0401  AST 17 14* 14*  ALT '14 10 12  ' ALKPHOS 81 70 75  BILITOT 0.3 0.7 0.9  PROT 5.9* 5.8* 5.9*  ALBUMIN 2.6* 2.5* 2.6*   CBC: Recent Labs  Lab 11/16/20 2037 11/18/20 0230 11/19/20 0401  WBC 14.0* 11.2* 12.5*  NEUTROABS  --  6.0 8.9*  HGB 14.3 13.6 13.6  HCT 41.8 41.0 38.7  MCV 91.5 93.4 91.1  PLT 289 264 261   CBG: Recent Labs  Lab 11/18/20 0951 11/18/20 1149 11/18/20 1648 11/18/20 2109 11/19/20 0612  GLUCAP 212* 283* 141* 202* 166*   Hgb A1c Recent Labs    11/16/20 2037  HGBA1C 12.0*   Lipid Profile Recent Labs    11/17/20 0202  CHOL 190  HDL 24*  LDLCALC 117*  TRIG 244*  CHOLHDL 7.9   Thyroid function studies No results for input(s): TSH, T4TOTAL, T3FREE, THYROIDAB in the last 72 hours.  Invalid input(s): FREET3 Urinalysis    Component Value Date/Time   COLORURINE YELLOW 11/17/2020 1510   APPEARANCEUR CLEAR 11/17/2020 1510   LABSPEC 1.016 11/17/2020 1510   PHURINE 5.0 11/17/2020 1510   GLUCOSEU 150 (A) 11/17/2020 1510   HGBUR SMALL (A) 11/17/2020 1510   BILIRUBINUR NEGATIVE 11/17/2020 1510   KETONESUR NEGATIVE 11/17/2020 1510   PROTEINUR 100 (A) 11/17/2020 1510   NITRITE NEGATIVE 11/17/2020 1510   LEUKOCYTESUR NEGATIVE 11/17/2020 1510    FURTHER DISCHARGE INSTRUCTIONS:   Get Medicines reviewed and  adjusted: Please take all your medications with you for your next visit with your Primary MD   Laboratory/radiological data: Please request your Primary MD to go over all hospital tests and procedure/radiological results at the follow up, please ask your Primary MD to get all Hospital records sent to his/her office.   In some cases, they will be blood work, cultures and biopsy results pending at the time of your discharge. Please request that your primary care M.D. goes through all the records of your hospital data and follows up on these results.   Also Note the following: If you experience worsening of your admission symptoms, develop shortness of breath, life threatening emergency, suicidal or homicidal thoughts you must seek medical attention immediately by calling 911 or calling your MD immediately  if symptoms less severe.   You must read complete instructions/literature along with all the possible adverse reactions/side effects for all the Medicines you take and that have been prescribed to you. Take any new Medicines after you have completely understood and accpet all the possible adverse reactions/side effects.    Do not drive when taking Pain medications or sleeping medications (Benzodaizepines)   Do not take more than prescribed Pain, Sleep and Anxiety Medications. It is not advisable to combine anxiety,sleep and pain medications without talking with your primary care practitioner   Special Instructions: If you have smoked or chewed Tobacco  in the last 2 yrs please stop smoking, stop any regular Alcohol  and or any Recreational drug use.   Wear Seat belts while driving.   Please note: You were cared for by a hospitalist during your hospital stay. Once  you are discharged, your primary care physician will handle any further medical issues. Please note that NO REFILLS for any discharge medications will be authorized once you are discharged, as it is imperative that you return to your  primary care physician (or establish a relationship with a primary care physician if you do not have one) for your post hospital discharge needs so that they can reassess your need for medications and monitor your lab values.  Time coordinating discharge: 40 minutes  SIGNED:  Marzetta Board, MD, PhD 11/19/2020, 11:13 AM

## 2020-11-22 ENCOUNTER — Other Ambulatory Visit: Payer: Self-pay | Admitting: Physician Assistant

## 2020-11-22 DIAGNOSIS — I6330 Cerebral infarction due to thrombosis of unspecified cerebral artery: Secondary | ICD-10-CM

## 2020-11-24 ENCOUNTER — Encounter: Payer: Self-pay | Admitting: *Deleted

## 2020-11-24 NOTE — Progress Notes (Signed)
Patient ID: Olivia Bell, female   DOB: Jan 19, 1956, 65 y.o.   MRN: [id number] Patient enrolled for [employer] to ship a 30 day cardiac event monitor to her home. Letter with instructions and discounted self pay program information mailed to patient.

## 2021-05-05 LAB — HEMOGLOBIN A1C: Hemoglobin A1C: 8.8

## 2021-05-05 LAB — BASIC METABOLIC PANEL
BUN: 20 (ref 4–21)
Creatinine: 0.8 (ref 0.5–1.1)
Glucose: 247

## 2021-05-05 LAB — COMPREHENSIVE METABOLIC PANEL
GFR calc Af Amer: 87
GFR calc non Af Amer: 87

## 2021-05-05 LAB — LIPID PANEL
LDL Cholesterol: 60
Triglycerides: 284 — AB (ref 40–160)

## 2021-05-05 LAB — TSH: TSH: 2.16 (ref 0.41–5.90)

## 2021-08-12 LAB — HEMOGLOBIN A1C: Hemoglobin A1C: 8.7

## 2021-08-20 ENCOUNTER — Encounter: Payer: Self-pay | Admitting: *Deleted

## 2021-08-21 ENCOUNTER — Ambulatory Visit: Payer: Self-pay | Admitting: Cardiology

## 2021-08-21 ENCOUNTER — Telehealth: Payer: Self-pay | Admitting: *Deleted

## 2021-08-21 DIAGNOSIS — I6330 Cerebral infarction due to thrombosis of unspecified cerebral artery: Secondary | ICD-10-CM

## 2021-08-21 NOTE — Telephone Encounter (Signed)
Patient aware that appointment for today has been canceled and she would receive her heart monitor in the mail at her home address from [facility]
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
## 2021-09-10 ENCOUNTER — Other Ambulatory Visit: Payer: Self-pay

## 2021-09-10 ENCOUNTER — Encounter: Payer: Self-pay | Admitting: Nurse Practitioner

## 2021-09-10 ENCOUNTER — Ambulatory Visit (INDEPENDENT_AMBULATORY_CARE_PROVIDER_SITE_OTHER): Payer: Medicaid Other | Admitting: Nurse Practitioner

## 2021-09-10 VITALS — BP 130/73 | HR 70 | Ht 65.0 in | Wt 252.0 lb

## 2021-09-10 DIAGNOSIS — E1122 Type 2 diabetes mellitus with diabetic chronic kidney disease: Secondary | ICD-10-CM

## 2021-09-10 DIAGNOSIS — N182 Chronic kidney disease, stage 2 (mild): Secondary | ICD-10-CM

## 2021-09-10 DIAGNOSIS — Z794 Long term (current) use of insulin: Secondary | ICD-10-CM

## 2021-09-10 NOTE — Patient Instructions (Signed)
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
## 2021-09-29 ENCOUNTER — Encounter: Payer: Self-pay | Admitting: *Deleted

## 2021-09-29 ENCOUNTER — Telehealth: Payer: Self-pay | Admitting: *Deleted

## 2021-09-29 NOTE — Progress Notes (Signed)
09/09/21-says she has monitor but has not placed it yet-says she will start it this week-check for baseline again when this comes   09/23/21 called/no answer/mailbox full   09/17/21-still no baseline-lmtc on pt vm    Received faxed notification on 09/25/2021 @12 :19 pm from [place] that 30 day event monitor was cancelled by patient

## 2021-09-29 NOTE — Telephone Encounter (Signed)
09/09/21-says she has monitor but has not placed it yet-says she will start it this week-check for baseline again when this comes   09/23/21 called/no answer/mailbox full   09/17/21-still no baseline-lmtc on pt vm    Received faxed notification on 09/25/2021 @12 :19 pm from [employer] that 30 day event monitor was cancelled by patient. PCP notified

## 2021-09-30 ENCOUNTER — Ambulatory Visit: Payer: Medicaid Other | Admitting: Nurse Practitioner

## 2021-09-30 NOTE — Patient Instructions (Incomplete)

## 2021-10-27 ENCOUNTER — Other Ambulatory Visit: Payer: Self-pay

## 2021-10-27 ENCOUNTER — Encounter: Payer: Self-pay | Admitting: Nurse Practitioner

## 2021-10-27 ENCOUNTER — Ambulatory Visit (INDEPENDENT_AMBULATORY_CARE_PROVIDER_SITE_OTHER): Payer: Medicaid Other | Admitting: Nurse Practitioner

## 2021-10-27 VITALS — BP 120/69 | HR 78 | Ht 65.0 in | Wt 242.0 lb

## 2021-10-27 DIAGNOSIS — E1122 Type 2 diabetes mellitus with diabetic chronic kidney disease: Secondary | ICD-10-CM

## 2021-10-27 DIAGNOSIS — N182 Chronic kidney disease, stage 2 (mild): Secondary | ICD-10-CM | POA: Diagnosis not present

## 2021-10-27 DIAGNOSIS — Z794 Long term (current) use of insulin: Secondary | ICD-10-CM

## 2021-10-27 MED ORDER — FREESTYLE LIBRE 2 READER DEVI: 0 refills | Status: AC

## 2021-10-27 MED ORDER — FREESTYLE LIBRE 2 SENSOR MISC: 3 refills | Status: AC
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
## 2021-10-27 NOTE — Patient Instructions (Signed)

## 2021-10-28 ENCOUNTER — Telehealth: Payer: Self-pay

## 2021-10-28 NOTE — Telephone Encounter (Signed)
Received a fax for a prior authorization for [employer] for the patient.  Key # [id number]

## 2021-10-28 NOTE — Telephone Encounter (Signed)
Received a fax from [facility] with an Approval for the [employer] starting on 10/27/2021 to 04/27/2022.

## 2022-01-26 ENCOUNTER — Ambulatory Visit: Payer: Medicaid Other | Admitting: Nurse Practitioner

## 2022-02-02 ENCOUNTER — Ambulatory Visit: Payer: Medicaid Other | Admitting: Nurse Practitioner

## 2022-02-02 NOTE — Patient Instructions (Incomplete)
Diabetes Mellitus and Foot Care Foot care is an important part of your health, especially when you have diabetes. Diabetes may cause you to have problems because of poor blood flow (circulation) to your feet and legs, which can cause your skin to: Become thinner and drier. Break more easily. Heal more slowly. Peel and crack. You may also have nerve damage (neuropathy) in your legs and feet, causing decreased feeling in them. This means that you may not notice minor injuries to your feet that could lead to more serious problems. Noticing and addressing any potential problems early is the best way to prevent future foot problems. How to care for your feet Foot hygiene  Wash your feet daily with warm water and mild soap. Do not use hot water. Then, pat your feet and the areas between your toes until they are completely dry. Do not soak your feet as this can dry your skin. Trim your toenails straight across. Do not dig under them or around the cuticle. File the edges of your nails with an emery board or nail file. Apply a moisturizing lotion or petroleum jelly to the skin on your feet and to dry, brittle toenails. Use lotion that does not contain alcohol and is unscented. Do not apply lotion between your toes. Shoes and socks Wear clean socks or stockings every day. Make sure they are not too tight. Do not wear knee-high stockings since they may decrease blood flow to your legs. Wear shoes that fit properly and have enough cushioning. Always look in your shoes before you put them on to be sure there are no objects inside. To break in new shoes, wear them for just a few hours a day. This prevents injuries on your feet. Wounds, scrapes, corns, and calluses  Check your feet daily for blisters, cuts, bruises, sores, and redness. If you cannot see the bottom of your feet, use a mirror or ask someone for help. Do not cut corns or calluses or try to remove them with medicine. If you find a minor scrape,  cut, or break in the skin on your feet, keep it and the skin around it clean and dry. You may clean these areas with mild soap and water. Do not clean the area with peroxide, alcohol, or iodine. If you have a wound, scrape, corn, or callus on your foot, look at it several times a day to make sure it is healing and not infected. Check for: Redness, swelling, or pain. Fluid or blood. Warmth. Pus or a bad smell. General tips Do not cross your legs. This may decrease blood flow to your feet. Do not use heating pads or hot water bottles on your feet. They may burn your skin. If you have lost feeling in your feet or legs, you may not know this is happening until it is too late. Protect your feet from hot and cold by wearing shoes, such as at the beach or on hot pavement. Schedule a complete foot exam at least once a year (annually) or more often if you have foot problems. Report any cuts, sores, or bruises to your health care provider immediately. Where to find more information American Diabetes Association: www.diabetes.org Association of Diabetes Care & Education Specialists: www.diabeteseducator.org Contact a health care provider if: You have a medical condition that increases your risk of infection and you have any cuts, sores, or bruises on your feet. You have an injury that is not healing. You have redness on your legs or feet. You   feel burning or tingling in your legs or feet. You have pain or cramps in your legs and feet. Your legs or feet are numb. Your feet always feel cold. You have pain around any toenails. Get help right away if: You have a wound, scrape, corn, or callus on your foot and: You have pain, swelling, or redness that gets worse. You have fluid or blood coming from the wound, scrape, corn, or callus. Your wound, scrape, corn, or callus feels warm to the touch. You have pus or a bad smell coming from the wound, scrape, corn, or callus. You have a fever. You have a red  line going up your leg. Summary Check your feet every day for blisters, cuts, bruises, sores, and redness. Apply a moisturizing lotion or petroleum jelly to the skin on your feet and to dry, brittle toenails. Wear shoes that fit properly and have enough cushioning. If you have foot problems, report any cuts, sores, or bruises to your health care provider immediately. Schedule a complete foot exam at least once a year (annually) or more often if you have foot problems. This information is not intended to replace advice given to you by your health care provider. Make sure you discuss any questions you have with your health care provider. Document Revised: 03/13/2020 Document Reviewed: 03/13/2020 Elsevier Patient Education  2023 [employer].  

## 2022-03-04 ENCOUNTER — Encounter: Payer: Medicaid Other | Admitting: Nurse Practitioner

## 2022-03-04 VITALS — Ht 65.0 in | Wt 235.0 lb

## 2022-03-04 NOTE — Progress Notes (Signed)
Erroneous encounter

## 2022-03-04 NOTE — Patient Instructions (Signed)
Diabetes Mellitus and Foot Care Foot care is an important part of your health, especially when you have diabetes. Diabetes may cause you to have problems because of poor blood flow (circulation) to your feet and legs, which can cause your skin to: Become thinner and drier. Break more easily. Heal more slowly. Peel and crack. You may also have nerve damage (neuropathy) in your legs and feet, causing decreased feeling in them. This means that you may not notice minor injuries to your feet that could lead to more serious problems. Noticing and addressing any potential problems early is the best way to prevent future foot problems. How to care for your feet Foot hygiene  Wash your feet daily with warm water and mild soap. Do not use hot water. Then, pat your feet and the areas between your toes until they are completely dry. Do not soak your feet as this can dry your skin. Trim your toenails straight across. Do not dig under them or around the cuticle. File the edges of your nails with an emery board or nail file. Apply a moisturizing lotion or petroleum jelly to the skin on your feet and to dry, brittle toenails. Use lotion that does not contain alcohol and is unscented. Do not apply lotion between your toes. Shoes and socks Wear clean socks or stockings every day. Make sure they are not too tight. Do not wear knee-high stockings since they may decrease blood flow to your legs. Wear shoes that fit properly and have enough cushioning. Always look in your shoes before you put them on to be sure there are no objects inside. To break in new shoes, wear them for just a few hours a day. This prevents injuries on your feet. Wounds, scrapes, corns, and calluses  Check your feet daily for blisters, cuts, bruises, sores, and redness. If you cannot see the bottom of your feet, use a mirror or ask someone for help. Do not cut corns or calluses or try to remove them with medicine. If you find a minor scrape,  cut, or break in the skin on your feet, keep it and the skin around it clean and dry. You may clean these areas with mild soap and water. Do not clean the area with peroxide, alcohol, or iodine. If you have a wound, scrape, corn, or callus on your foot, look at it several times a day to make sure it is healing and not infected. Check for: Redness, swelling, or pain. Fluid or blood. Warmth. Pus or a bad smell. General tips Do not cross your legs. This may decrease blood flow to your feet. Do not use heating pads or hot water bottles on your feet. They may burn your skin. If you have lost feeling in your feet or legs, you may not know this is happening until it is too late. Protect your feet from hot and cold by wearing shoes, such as at the beach or on hot pavement. Schedule a complete foot exam at least once a year (annually) or more often if you have foot problems. Report any cuts, sores, or bruises to your health care provider immediately. Where to find more information American Diabetes Association: www.diabetes.org [employer] Education [employer]: www.diabeteseducator.org Contact a health care provider if: You have a medical condition that increases your risk of infection and you have any cuts, sores, or bruises on your feet. You have an injury that is not healing. You have redness on your legs or feet. You   feel burning or tingling in your legs or feet. You have pain or cramps in your legs and feet. Your legs or feet are numb. Your feet always feel cold. You have pain around any toenails. Get help right away if: You have a wound, scrape, corn, or callus on your foot and: You have pain, swelling, or redness that gets worse. You have fluid or blood coming from the wound, scrape, corn, or callus. Your wound, scrape, corn, or callus feels warm to the touch. You have pus or a bad smell coming from the wound, scrape, corn, or callus. You have a fever. You have a red  line going up your leg. Summary Check your feet every day for blisters, cuts, bruises, sores, and redness. Apply a moisturizing lotion or petroleum jelly to the skin on your feet and to dry, brittle toenails. Wear shoes that fit properly and have enough cushioning. If you have foot problems, report any cuts, sores, or bruises to your health care provider immediately. Schedule a complete foot exam at least once a year (annually) or more often if you have foot problems. This information is not intended to replace advice given to you by your health care provider. Make sure you discuss any questions you have with your health care provider. Document Revised: 03/13/2020 Document Reviewed: 03/13/2020 [employer] Patient Education  2023 [employer]  

## 2022-03-29 ENCOUNTER — Other Ambulatory Visit (HOSPITAL_BASED_OUTPATIENT_CLINIC_OR_DEPARTMENT_OTHER): Payer: Self-pay

## 2022-03-29 DIAGNOSIS — R0681 Apnea, not elsewhere classified: Secondary | ICD-10-CM

## 2023-01-24 ENCOUNTER — Ambulatory Visit: Payer: Medicaid Other | Admitting: Obstetrics & Gynecology

## 2023-01-27 ENCOUNTER — Ambulatory Visit: Payer: Medicaid Other | Admitting: Obstetrics & Gynecology

## 2023-03-17 ENCOUNTER — Ambulatory Visit: Payer: Medicaid Other | Admitting: Obstetrics & Gynecology

## 2023-04-07 ENCOUNTER — Ambulatory Visit: Payer: 59 | Admitting: Obstetrics & Gynecology

## 2023-10-25 DIAGNOSIS — E119 Type 2 diabetes mellitus without complications: Secondary | ICD-10-CM | POA: Diagnosis not present

## 2023-11-04 DIAGNOSIS — E119 Type 2 diabetes mellitus without complications: Secondary | ICD-10-CM | POA: Diagnosis not present

## 2023-11-08 DIAGNOSIS — I10 Essential (primary) hypertension: Secondary | ICD-10-CM | POA: Diagnosis not present

## 2023-11-08 DIAGNOSIS — N1832 Chronic kidney disease, stage 3b: Secondary | ICD-10-CM | POA: Diagnosis not present

## 2023-11-08 DIAGNOSIS — E1122 Type 2 diabetes mellitus with diabetic chronic kidney disease: Secondary | ICD-10-CM | POA: Diagnosis not present

## 2023-11-08 DIAGNOSIS — Z0000 Encounter for general adult medical examination without abnormal findings: Secondary | ICD-10-CM | POA: Diagnosis not present

## 2023-11-08 DIAGNOSIS — J449 Chronic obstructive pulmonary disease, unspecified: Secondary | ICD-10-CM | POA: Diagnosis not present

## 2023-11-08 DIAGNOSIS — I872 Venous insufficiency (chronic) (peripheral): Secondary | ICD-10-CM | POA: Diagnosis not present

## 2023-11-08 DIAGNOSIS — Z8673 Personal history of transient ischemic attack (TIA), and cerebral infarction without residual deficits: Secondary | ICD-10-CM | POA: Diagnosis not present

## 2023-11-08 DIAGNOSIS — K219 Gastro-esophageal reflux disease without esophagitis: Secondary | ICD-10-CM | POA: Diagnosis not present

## 2024-02-22 DIAGNOSIS — E119 Type 2 diabetes mellitus without complications: Secondary | ICD-10-CM | POA: Diagnosis not present

## 2024-03-27 DIAGNOSIS — N1832 Chronic kidney disease, stage 3b: Secondary | ICD-10-CM | POA: Diagnosis not present

## 2024-03-27 DIAGNOSIS — K219 Gastro-esophageal reflux disease without esophagitis: Secondary | ICD-10-CM | POA: Diagnosis not present

## 2024-03-27 DIAGNOSIS — I872 Venous insufficiency (chronic) (peripheral): Secondary | ICD-10-CM | POA: Diagnosis not present

## 2024-03-27 DIAGNOSIS — I10 Essential (primary) hypertension: Secondary | ICD-10-CM | POA: Diagnosis not present

## 2024-03-27 DIAGNOSIS — E1122 Type 2 diabetes mellitus with diabetic chronic kidney disease: Secondary | ICD-10-CM | POA: Diagnosis not present

## 2024-03-27 DIAGNOSIS — J449 Chronic obstructive pulmonary disease, unspecified: Secondary | ICD-10-CM | POA: Diagnosis not present

## 2024-03-27 DIAGNOSIS — R0602 Shortness of breath: Secondary | ICD-10-CM | POA: Diagnosis not present

## 2024-03-27 DIAGNOSIS — Z8673 Personal history of transient ischemic attack (TIA), and cerebral infarction without residual deficits: Secondary | ICD-10-CM | POA: Diagnosis not present

## 2024-03-27 DIAGNOSIS — Z0000 Encounter for general adult medical examination without abnormal findings: Secondary | ICD-10-CM | POA: Diagnosis not present

## 2024-03-29 ENCOUNTER — Encounter (INDEPENDENT_AMBULATORY_CARE_PROVIDER_SITE_OTHER): Payer: Self-pay | Admitting: *Deleted

## 2024-06-13 NOTE — Progress Notes (Signed)
 Olivia Bell                                          MRN: [id number]   06/13/2024   The [facility] Quality Team Specialist reviewed this patient medical record for the purposes of chart review for care gap closure. The following were reviewed: chart review for care gap closure-kidney health evaluation for diabetes:uACR.    [facility] Quality Team

## 2024-10-02 ENCOUNTER — Encounter (INDEPENDENT_AMBULATORY_CARE_PROVIDER_SITE_OTHER): Payer: Self-pay | Admitting: *Deleted
